# Patient Record
Sex: Male | Born: 1955
Health system: Southern US, Community
[De-identification: ages and names within clinical notes are randomized; demographics above are authoritative.]

## PROBLEM LIST (undated history)

## (undated) DIAGNOSIS — E785 Hyperlipidemia, unspecified: Secondary | ICD-10-CM

## (undated) DIAGNOSIS — I10 Essential (primary) hypertension: Secondary | ICD-10-CM

## (undated) DIAGNOSIS — T7840XA Allergy, unspecified, initial encounter: Secondary | ICD-10-CM

## (undated) DIAGNOSIS — E119 Type 2 diabetes mellitus without complications: Secondary | ICD-10-CM

## (undated) DIAGNOSIS — J309 Allergic rhinitis, unspecified: Secondary | ICD-10-CM

## (undated) HISTORY — DX: Hyperlipidemia, unspecified: E78.5

## (undated) HISTORY — DX: Type 2 diabetes mellitus without complications: E11.9

## (undated) HISTORY — DX: Allergy, unspecified, initial encounter: T78.40XA

## (undated) HISTORY — PX: ANTERIOR CRUCIATE LIGAMENT REPAIR: SHX115

## (undated) HISTORY — PX: OTHER SURGICAL HISTORY: SHX169

## (undated) HISTORY — DX: Essential (primary) hypertension: I10

## (undated) HISTORY — PX: COLONOSCOPY: SHX174

## (undated) HISTORY — DX: Allergic rhinitis, unspecified: J30.9

---

## 2004-10-29 ENCOUNTER — Ambulatory Visit: Payer: Self-pay | Admitting: Internal Medicine

## 2005-06-22 ENCOUNTER — Ambulatory Visit: Payer: Self-pay | Admitting: Internal Medicine

## 2005-06-24 ENCOUNTER — Ambulatory Visit: Payer: Self-pay | Admitting: Internal Medicine

## 2005-08-27 ENCOUNTER — Ambulatory Visit: Payer: Self-pay | Admitting: Internal Medicine

## 2005-10-27 ENCOUNTER — Ambulatory Visit: Payer: Self-pay | Admitting: Family Medicine

## 2006-07-13 ENCOUNTER — Ambulatory Visit: Payer: Self-pay | Admitting: Internal Medicine

## 2006-07-28 ENCOUNTER — Ambulatory Visit: Payer: Self-pay | Admitting: Internal Medicine

## 2006-09-02 ENCOUNTER — Ambulatory Visit: Payer: Self-pay | Admitting: Internal Medicine

## 2006-09-27 ENCOUNTER — Ambulatory Visit: Payer: Self-pay | Admitting: Internal Medicine

## 2007-08-20 ENCOUNTER — Emergency Department (HOSPITAL_COMMUNITY): Admission: EM | Admit: 2007-08-20 | Discharge: 2007-08-21 | Payer: Self-pay | Admitting: Family Medicine

## 2007-08-22 ENCOUNTER — Encounter: Payer: Self-pay | Admitting: Internal Medicine

## 2007-08-22 ENCOUNTER — Ambulatory Visit: Payer: Self-pay | Admitting: Internal Medicine

## 2007-08-22 DIAGNOSIS — I10 Essential (primary) hypertension: Secondary | ICD-10-CM | POA: Insufficient documentation

## 2007-08-22 DIAGNOSIS — E785 Hyperlipidemia, unspecified: Secondary | ICD-10-CM | POA: Insufficient documentation

## 2007-08-30 ENCOUNTER — Ambulatory Visit: Payer: Self-pay

## 2007-09-21 ENCOUNTER — Ambulatory Visit: Payer: Self-pay | Admitting: Internal Medicine

## 2007-09-21 LAB — CONVERTED CEMR LAB
ALT: 47 units/L (ref 0–53)
AST: 27 units/L (ref 0–37)
Alkaline Phosphatase: 66 units/L (ref 39–117)
BUN: 14 mg/dL (ref 6–23)
Basophils Relative: 0.3 % (ref 0.0–1.0)
Bilirubin, Direct: 0.3 mg/dL (ref 0.0–0.3)
Calcium: 9.6 mg/dL (ref 8.4–10.5)
Chloride: 103 meq/L (ref 96–112)
Eosinophils Absolute: 0.1 10*3/uL (ref 0.0–0.6)
Eosinophils Relative: 0.9 % (ref 0.0–5.0)
GFR calc non Af Amer: 95 mL/min
Glucose, Bld: 114 mg/dL — ABNORMAL HIGH (ref 70–99)
Ketones, ur: NEGATIVE mg/dL
LDL Cholesterol: 96 mg/dL (ref 0–99)
MCV: 91.9 fL (ref 78.0–100.0)
Monocytes Relative: 9.3 % (ref 3.0–11.0)
Nitrite: NEGATIVE
Platelets: 293 10*3/uL (ref 150–400)
RBC: 4.82 M/uL (ref 4.22–5.81)
Specific Gravity, Urine: 1.01 (ref 1.000–1.03)
Total CHOL/HDL Ratio: 4.3
Total Protein, Urine: NEGATIVE mg/dL
Urine Glucose: NEGATIVE mg/dL
VLDL: 29 mg/dL (ref 0–40)
WBC: 6.9 10*3/uL (ref 4.5–10.5)

## 2007-09-23 ENCOUNTER — Encounter: Payer: Self-pay | Admitting: Internal Medicine

## 2007-09-23 ENCOUNTER — Ambulatory Visit: Payer: Self-pay | Admitting: Internal Medicine

## 2008-10-29 ENCOUNTER — Ambulatory Visit: Payer: Self-pay | Admitting: Internal Medicine

## 2008-10-29 LAB — CONVERTED CEMR LAB
ALT: 49 units/L (ref 0–53)
Alkaline Phosphatase: 59 units/L (ref 39–117)
Basophils Absolute: 0 10*3/uL (ref 0.0–0.1)
Bilirubin Urine: NEGATIVE
Bilirubin, Direct: 0.1 mg/dL (ref 0.0–0.3)
CO2: 31 meq/L (ref 19–32)
Calcium: 9 mg/dL (ref 8.4–10.5)
Glucose, Bld: 107 mg/dL — ABNORMAL HIGH (ref 70–99)
HDL: 40.6 mg/dL (ref >39.0)
Hemoglobin, Urine: NEGATIVE
Ketones, ur: NEGATIVE mg/dL
LDL Cholesterol: 108 mg/dL — ABNORMAL HIGH (ref 0–99)
Leukocytes, UA: NEGATIVE
Lymphocytes Relative: 35.1 % (ref 12.0–46.0)
MCHC: 35.8 g/dL (ref 30.0–36.0)
Monocytes Relative: 9.9 % (ref 3.0–12.0)
Neutrophils Relative %: 53.4 % (ref 43.0–77.0)
Nitrite: NEGATIVE
Platelets: 267 10*3/uL (ref 150–400)
Potassium: 3.7 meq/L (ref 3.5–5.1)
RDW: 11.5 % (ref 11.5–14.6)
Sodium: 140 meq/L (ref 135–145)
TSH: 0.84 microintl units/mL (ref 0.35–5.50)
Total Bilirubin: 0.9 mg/dL (ref 0.3–1.2)
Total CHOL/HDL Ratio: 4.2
Total Protein: 6.9 g/dL (ref 6.0–8.3)
Urobilinogen, UA: 0.2 (ref 0.0–1.0)
VLDL: 21 mg/dL (ref 0–40)
pH: 6 (ref 5.0–8.0)

## 2008-11-05 ENCOUNTER — Ambulatory Visit: Payer: Self-pay | Admitting: Internal Medicine

## 2009-08-21 ENCOUNTER — Telehealth: Payer: Self-pay | Admitting: Internal Medicine

## 2009-08-22 ENCOUNTER — Ambulatory Visit: Payer: Self-pay | Admitting: Internal Medicine

## 2009-10-01 ENCOUNTER — Encounter: Payer: Self-pay | Admitting: Internal Medicine

## 2009-10-01 ENCOUNTER — Encounter: Payer: Self-pay | Admitting: Sports Medicine

## 2009-10-09 ENCOUNTER — Ambulatory Visit: Payer: Self-pay | Admitting: Sports Medicine

## 2009-10-09 DIAGNOSIS — M76829 Posterior tibial tendinitis, unspecified leg: Secondary | ICD-10-CM | POA: Insufficient documentation

## 2009-10-09 DIAGNOSIS — R269 Unspecified abnormalities of gait and mobility: Secondary | ICD-10-CM | POA: Insufficient documentation

## 2009-12-12 ENCOUNTER — Ambulatory Visit (HOSPITAL_BASED_OUTPATIENT_CLINIC_OR_DEPARTMENT_OTHER): Admission: RE | Admit: 2009-12-12 | Discharge: 2009-12-13 | Payer: Self-pay | Admitting: Orthopedic Surgery

## 2010-01-21 ENCOUNTER — Telehealth: Payer: Self-pay | Admitting: Internal Medicine

## 2010-02-17 ENCOUNTER — Telehealth: Payer: Self-pay | Admitting: Internal Medicine

## 2010-03-24 ENCOUNTER — Ambulatory Visit: Payer: Self-pay | Admitting: Internal Medicine

## 2010-03-24 LAB — CONVERTED CEMR LAB
ALT: 71 units/L — ABNORMAL HIGH (ref 0–53)
AST: 44 units/L — ABNORMAL HIGH (ref 0–37)
Alkaline Phosphatase: 55 units/L (ref 39–117)
BUN: 13 mg/dL (ref 6–23)
Basophils Absolute: 0 10*3/uL (ref 0.0–0.1)
Bilirubin, Direct: 0.1 mg/dL (ref 0.0–0.3)
Calcium: 9.4 mg/dL (ref 8.4–10.5)
Cholesterol: 195 mg/dL (ref 0–200)
Eosinophils Relative: 1.6 % (ref 0.0–5.0)
GFR calc non Af Amer: 107 mL/min (ref >60)
Glucose, Bld: 104 mg/dL — ABNORMAL HIGH (ref 70–99)
HDL: 44.9 mg/dL (ref >39.00)
Ketones, ur: NEGATIVE mg/dL
LDL Cholesterol: 118 mg/dL — ABNORMAL HIGH (ref 0–99)
Leukocytes, UA: NEGATIVE
Lymphocytes Relative: 25.4 % (ref 12.0–46.0)
Monocytes Relative: 8.8 % (ref 3.0–12.0)
Neutrophils Relative %: 63.8 % (ref 43.0–77.0)
Platelets: 268 10*3/uL (ref 150.0–400.0)
Potassium: 4.3 meq/L (ref 3.5–5.1)
RDW: 12.6 % (ref 11.5–14.6)
Specific Gravity, Urine: 1.02 (ref 1.000–1.030)
TSH: 0.88 microintl units/mL (ref 0.35–5.50)
Total Bilirubin: 1.3 mg/dL — ABNORMAL HIGH (ref 0.3–1.2)
Total Protein, Urine: NEGATIVE mg/dL
Urine Glucose: NEGATIVE mg/dL
VLDL: 32.2 mg/dL (ref 0.0–40.0)
WBC: 6.8 10*3/uL (ref 4.5–10.5)
pH: 7 (ref 5.0–8.0)

## 2010-03-28 ENCOUNTER — Ambulatory Visit: Payer: Self-pay | Admitting: Internal Medicine

## 2010-03-31 DIAGNOSIS — S83509A Sprain of unspecified cruciate ligament of unspecified knee, initial encounter: Secondary | ICD-10-CM | POA: Insufficient documentation

## 2010-12-31 NOTE — Assessment & Plan Note (Signed)
Summary: CPX/[facility] HC/#/CD   Vital Signs:  Patient profile:   55 year old male Height:      67 inches Weight:      202 pounds BMI:     31.75 O2 Sat:      97 % on Room air Temp:     98.1 degrees F oral Pulse rate:   66 / minute BP sitting:   132 / 88  (left arm) Cuff size:   large  Vitals Entered By: Bill Salinas CMA (March 28, 2010 2:30 PM)  O2 Flow:  Room air CC: cpx, pt is due for tetanus/ ab  Vision Screening:      Vision Comments: Pt's last eye exam was 03/2009 with Dr Hyacinth Meeker   Primary Care Provider:  Akiel Fennell  CC:  cpx and pt is due for tetanus/ ab.  History of Present Illness: Patient presents for medical follow up. Last seen in September for vertigo. In the interval he had to have repair, arthroscopically, left ACL with cadaveric implant. He has also seen Dr. Darrick Penna for fallen arches and has had orthotics made. He is still having pain in the left foot. These events have limited his activity and he feels that his weigh gain is a consequence. He otherwise is feeling well.  Current Medications (verified): 1)  Hydrochlorothiazide 25 Mg Tabs (Hydrochlorothiazide) .... Take 1 Tablet By Mouth Once A Day 2)  Lipitor 40 Mg Tabs (Atorvastatin Calcium) .... Take 1 Tablet By Mouth Once A Day 3)  Aspirin 81 Mg  Tabs (Aspirin) .... Take 1 Tablet By Mouth Once A Day 4)  Claritin 10 Mg Tabs (Loratadine) .... Take 1 Tablet By Mouth Once A Day As Needed 5)  Vitamin C 1000 Mg Tabs (Ascorbic Acid) .... Take 1 Tablet By Mouth Once A Day 6)  Vitamin E 400 Unit Caps (Vitamin E) .... Take 1 Tablet By Mouth Once A Day 7)  Folic Acid 400 Mcg Tabs (Folic Acid) .... Take 1 Tablet By Mouth Once A Day  Allergies (verified): No Known Drug Allergies  Past History:  Past Medical History: Last updated: 08/22/2007 UCD Hyperlipidemia Hypertension  Past Surgical History: arthroscopic knee surgery - left '00 Afrthroscopic repair ACL- left '11 (Dan Murphy)  Family History: father died 48  MI brother s/p MI, PCI Brother - CAD/CABG @ 29 ('14) brother - CAD/MI then CABG - ('10) all brothers with hyperlipidemia mother with HTN Secondary kin paternal with diabetes  Social History: married - '76 2 sons - out of the house. 1st son is principal at [employer].  Married in fall '10 2nd son - working for [employer] ('11) but still looking. work -Oceanographer.  Review of Systems       The patient complains of weight gain.  The patient denies anorexia, fever, vision loss, hoarseness, chest pain, syncope, dyspnea on exertion, prolonged cough, abdominal pain, severe indigestion/heartburn, hematuria, muscle weakness, difficulty walking, depression, abnormal bleeding, and enlarged lymph nodes.    Physical Exam  General:  WNWD male who has gained some weight Head:  Normocephalic and atraumatic without obvious abnormalities. No apparent alopecia or balding. Eyes:  No corneal or conjunctival inflammation noted. EOMI. Perrla. Funduscopic exam benign, without hemorrhages, exudates or papilledema. Vision grossly normal. Ears:  External ear exam shows no significant lesions or deformities.  Otoscopic examination reveals clear canals, tympanic membranes are intact bilaterally without bulging, retraction, inflammation or discharge. Hearing is grossly normal bilaterally. Nose:  no external deformity and no external erythema.  Mouth:  Oral mucosa and oropharynx without lesions or exudates.  Teeth in good repair. Neck:  supple, full ROM, no thyromegaly, and no carotid bruits.   Chest Wall:  No deformities, masses, tenderness or gynecomastia noted. Lungs:  Normal respiratory effort, chest expands symmetrically. Lungs are clear to auscultation, no crackles or wheezes. Heart:  Normal rate and regular rhythm. S1 and S2 normal without gallop, murmur, click, rub or other extra sounds. Abdomen:  soft, non-tender, normal bowel sounds, and no hepatomegaly.   Rectal:  No external  abnormalities noted. Normal sphincter tone. No rectal masses or tenderness. Prostate:  Prostate gland firm and smooth, no enlargement, nodularity, tenderness, mass, asymmetry or induration. Msk:  normal ROM, no joint swelling, no joint warmth, no joint deformities, and no joint instability. Both knees look fine with no effusion or deformity  Pulses:  2+ radial and DP pulse Extremities:  No clubbing, cyanosis, edema, or deformity noted with normal full range of motion of all joints.   Neurologic:  alert & oriented X3, cranial nerves II-XII intact, strength normal in all extremities, sensation intact to pinprick, and gait normal.   Skin:  turgor normal, color normal, no rashes, and no ulcerations.   Cervical Nodes:  no anterior cervical adenopathy and no posterior cervical adenopathy.   Inguinal Nodes:  no R inguinal adenopathy and no L inguinal adenopathy.   Psych:  Oriented X3, memory intact for recent and remote, normally interactive, and good eye contact.     Impression & Recommendations:  Problem # 1:  Hx of ACL TEAR, RIGHT KNEE (ICD-844.2) He has made a good recovery. He is no released for full physical activity - he has been told July before he can waterski.  Problem # 2:  ABNORMALITY OF GAIT (ICD-781.2) He is still having foot pain. He follows with Dr. Darrick Penna. He is planning to buy supportive shoes, e.g. [employer].  Problem # 3:  HYPERTENSION (ICD-401.9)  His updated medication list for this problem includes:    Hydrochlorothiazide 25 Mg Tabs (Hydrochlorothiazide) .Marland Kitchen... Take 1 tablet by mouth once a day  BP today: 132/88 Prior BP: 121/86 (10/09/2009)  Adequate control - will continue present medication.  Problem # 4:  HYPERLIPIDEMIA (ICD-272.4) LDL is  118 - at minimum goal of less than 130, but with his family history further reduction to 100 or less is desireable.  Plan - continue present dose of medication           resume exercise ASAP and continue a healthy low-fat  diet.  His updated medication list for this problem includes:    Lipitor 40 Mg Tabs (Atorvastatin calcium) .Marland Kitchen... Take 1 tablet by mouth once a day  Problem # 5:  Preventive Health Care (ICD-V70.0) Medical history is unremarkable in the interval. Physical exam is normal. Prostate cancer screening is normal. He is due for colorectal cancer screening with colonoscopy - he will be scheduled by patient care coordinators. Lab results look fine. He is due for tetnus booster unless he can recall a booster in the last 10 years.  In summary - a very nice man who appears to be medically stable and orthopedically improving. He will return for full exam in 18-24 months or as needed.   Complete Medication List: 1)  Hydrochlorothiazide 25 Mg Tabs (Hydrochlorothiazide) .... Take 1 tablet by mouth once a day 2)  Lipitor 40 Mg Tabs (Atorvastatin calcium) .... Take 1 tablet by mouth once a day 3)  Aspirin 81 Mg Tabs (Aspirin) .Marland KitchenMarland KitchenMarland Kitchen  Take 1 tablet by mouth once a day 4)  Claritin 10 Mg Tabs (Loratadine) .... Take 1 tablet by mouth once a day as needed 5)  Vitamin C 1000 Mg Tabs (Ascorbic acid) .... Take 1 tablet by mouth once a day 6)  Vitamin E 400 Unit Caps (Vitamin e) .... Take 1 tablet by mouth once a day 7)  Folic Acid 400 Mcg Tabs (Folic acid) .... Take 1 tablet by mouth once a day  Patient: James Weaver Note: All result statuses are Final unless otherwise noted.  Tests: (1) Lipid Panel (LIPID)   Cholesterol               195 mg/dL                   1-610     ATP III Classification            Desirable:  < 200 mg/dL                    Borderline High:  200 - 239 mg/dL               High:  > = 240 mg/dL   Triglycerides        [H]  161.0 mg/dL                 9.6-045.4     Normal:  <150 mg/dL     Borderline High:  098 - 199 mg/dL   HDL                       11.91 mg/dL                 >47.82   VLDL Cholesterol          32.2 mg/dL                  9.5-62.1   LDL Cholesterol      [H]  308 mg/dL                    6-57  CHO/HDL Ratio:  CHD Risk                             4                    Men          Women     1/2 Average Risk     3.4          3.3     Average Risk          5.0          4.4     2X Average Risk          9.6          7.1     3X Average Risk          15.0          11.0                           Tests: (2) BMP (METABOL)   Sodium                    140 mEq/L  135-145   Potassium                 4.3 mEq/L                   3.5-5.1   Chloride                  100 mEq/L                   96-112   Carbon Dioxide            32 mEq/L                    19-32   Glucose              [H]  104 mg/dL                   13-08   BUN                       13 mg/dL                    6-57   Creatinine                0.8 mg/dL                   8.4-6.9   Calcium                   9.4 mg/dL                   6.2-95.2   GFR                       107.00 mL/min               >60  Tests: (3) CBC Platelet w/Diff (CBCD)   White Cell Count          6.8 K/uL                    4.5-10.5   Red Cell Count            4.86 Mil/uL                 4.22-5.81   Hemoglobin                16.1 g/dL                   84.1-32.4   Hematocrit                45.8 %                      39.0-52.0   MCV                       94.3 fl                     78.0-100.0   MCHC                      35.2 g/dL                   40.1-02.7   RDW  12.6 %                      11.5-14.6   Platelet Count            268.0 K/uL                  150.0-400.0   Neutrophil %              63.8 %                      43.0-77.0   Lymphocyte %              25.4 %                      12.0-46.0   Monocyte %                8.8 %                       3.0-12.0   Eosinophils%              1.6 %                       0.0-5.0   Basophils %               0.4 %                       0.0-3.0   Neutrophill Absolute      4.3 K/uL                    1.4-7.7   Lymphocyte Absolute       1.7 K/uL                     0.7-4.0   Monocyte Absolute         0.6 K/uL                    0.1-1.0  Eosinophils, Absolute                             0.1 K/uL                    0.0-0.7   Basophils Absolute        0.0 K/uL                    0.0-0.1  Tests: (4) Hepatic/Liver Function Panel (HEPATIC)   Total Bilirubin      [H]  1.3 mg/dL                   0.4-5.4   Direct Bilirubin          0.1 mg/dL                   0.9-8.1   Alkaline Phosphatase      55 U/L                      39-117   AST                  [H]  44 U/L  0-37   ALT                  [H]  71 U/L                      0-53   Total Protein             7.0 g/dL                    1.6-1.0   Albumin                   4.1 g/dL                    9.6-0.4  Tests: (5) TSH (TSH)   FastTSH                   0.88 uIU/mL                 0.35-5.50  Tests: (6) UDip Only (UDIP)   Color                     YELLOW       RANGE:  Yellow;Lt. Yellow   Clarity                   CLEAR                       Clear   Specific Gravity          1.020                       1.000 - 1.030   Urine Ph                  7.0                         5.0-8.0   Protein                   NEGATIVE                    Negative   Urine Glucose             NEGATIVE                    Negative   Ketones                   NEGATIVE                    Negative   Urine Bilirubin           NEGATIVE                    Negative   Blood                     TRACE-LYSED                 Negative   Urobilinogen              0.2                         0.0 - 1.0   Leukocyte Esterace        NEGATIVE  Negative   Nitrite                   NEGATIVE                    Negative  Tests: (7) Prostate Specific Antigen (PSA)   PSA-Hyb                   2.18 ng/mL                  0.10-4.00

## 2010-12-31 NOTE — Progress Notes (Signed)
  Phone Note Refill Request Message from:  Fax from Pharmacy on February 17, 2010 4:25 PM  Refills Requested: Medication #1:  HYDROCHLOROTHIAZIDE 25 MG TABS Take 1 tablet by mouth once a day   Supply Requested: 3 months Initial call taken by: Rock Nephew CMA,  February 17, 2010 4:25 PM    Prescriptions: HYDROCHLOROTHIAZIDE 25 MG TABS (HYDROCHLOROTHIAZIDE) Take 1 tablet by mouth once a day  #90Tablet x 3   Entered by:   Rock Nephew CMA   Authorized by:   Jacques Navy MD   Signed by:   Rock Nephew CMA on 02/17/2010   Method used:   Electronically to        [facility]  [address] [phone]* (retail)       [address]       [place], [place]  [address]       Ph: [phone]       Fax: [phone]   RxID:   [id number]

## 2010-12-31 NOTE — Progress Notes (Signed)
  Phone Note Refill Request Message from:  Fax from Pharmacy on January 21, 2010 12:22 PM  Refills Requested: Medication #1:  LIPITOR 40 MG TABS Take 1 tablet by mouth once a day Initial call taken by: Ami Bullins CMA,  January 21, 2010 12:22 PM    Prescriptions: LIPITOR 40 MG TABS (ATORVASTATIN CALCIUM) Take 1 tablet by mouth once a day  #90 x 3   Entered by:   Ami Bullins CMA   Authorized by:   Jacques Navy MD   Signed by:   Bill Salinas CMA on 01/21/2010   Method used:   Faxed to ...       [facility] ([facility]-order)       [address] [place], [place]  [address]       Ph: [phone]       Fax: [phone]   RxID:   [id number]

## 2011-01-20 ENCOUNTER — Telehealth: Payer: Self-pay | Admitting: Internal Medicine

## 2011-01-27 NOTE — Progress Notes (Signed)
  Phone Note Refill Request Message from:  Fax from Pharmacy on January 20, 2011 4:35 PM  Refills Requested: Medication #1:  LIPITOR 40 MG TABS Take 1 tablet by mouth once a day Initial call taken by: Ami Bullins CMA,  January 20, 2011 4:35 PM    Prescriptions: LIPITOR 40 MG TABS (ATORVASTATIN CALCIUM) Take 1 tablet by mouth once a day  #90 x 3   Entered by:   Ami Bullins CMA   Authorized by:   Jacques Navy MD   Signed by:   Bill Salinas CMA on 01/20/2011   Method used:   Faxed to ...       [facility] (mail-order)       [address] [place], [place]  [address]       Ph: [phone]       Fax: [phone]   RxID:   [id number]

## 2011-02-15 LAB — BASIC METABOLIC PANEL
CO2: 33 mEq/L — ABNORMAL HIGH (ref 19–32)
Calcium: 9.4 mg/dL (ref 8.4–10.5)
GFR calc Af Amer: >60 mL/min (ref >60)
Potassium: 4 mEq/L (ref 3.5–5.1)
Sodium: 139 mEq/L (ref 135–145)

## 2011-02-15 LAB — HEPATIC FUNCTION PANEL
ALT: 50 U/L (ref 0–53)
AST: 32 U/L (ref 0–37)
Albumin: 3.9 g/dL (ref 3.5–5.2)
Total Bilirubin: 1.2 mg/dL (ref 0.3–1.2)

## 2011-03-25 ENCOUNTER — Other Ambulatory Visit: Payer: Self-pay | Admitting: Internal Medicine

## 2011-03-25 ENCOUNTER — Other Ambulatory Visit (INDEPENDENT_AMBULATORY_CARE_PROVIDER_SITE_OTHER): Payer: Self-pay

## 2011-03-25 DIAGNOSIS — Z0000 Encounter for general adult medical examination without abnormal findings: Secondary | ICD-10-CM

## 2011-03-25 DIAGNOSIS — Z125 Encounter for screening for malignant neoplasm of prostate: Secondary | ICD-10-CM

## 2011-03-25 DIAGNOSIS — E785 Hyperlipidemia, unspecified: Secondary | ICD-10-CM

## 2011-03-25 DIAGNOSIS — I10 Essential (primary) hypertension: Secondary | ICD-10-CM

## 2011-03-25 LAB — LIPID PANEL
Cholesterol: 177 mg/dL (ref 0–200)
Triglycerides: 142 mg/dL (ref 0.0–149.0)
VLDL: 28.4 mg/dL (ref 0.0–40.0)

## 2011-03-25 LAB — CBC WITH DIFFERENTIAL/PLATELET
Basophils Relative: 0.5 % (ref 0.0–3.0)
Eosinophils Absolute: 0.1 10*3/uL (ref 0.0–0.7)
Lymphocytes Relative: 30.7 % (ref 12.0–46.0)
MCHC: 35.2 g/dL (ref 30.0–36.0)
Neutrophils Relative %: 55.7 % (ref 43.0–77.0)
Platelets: 270 10*3/uL (ref 150.0–400.0)
RBC: 4.7 Mil/uL (ref 4.22–5.81)
WBC: 6.8 10*3/uL (ref 4.5–10.5)

## 2011-03-25 LAB — BASIC METABOLIC PANEL
CO2: 32 mEq/L (ref 19–32)
Calcium: 9.3 mg/dL (ref 8.4–10.5)
Creatinine, Ser: 0.8 mg/dL (ref 0.4–1.5)
GFR: 111.41 mL/min (ref >60.00)
Sodium: 138 mEq/L (ref 135–145)

## 2011-03-25 LAB — PSA: PSA: 1.77 ng/mL (ref 0.10–4.00)

## 2011-03-25 LAB — HEPATIC FUNCTION PANEL
Bilirubin, Direct: 0.2 mg/dL (ref 0.0–0.3)
Total Protein: 6.8 g/dL (ref 6.0–8.3)

## 2011-03-31 ENCOUNTER — Ambulatory Visit (INDEPENDENT_AMBULATORY_CARE_PROVIDER_SITE_OTHER): Payer: 59 | Admitting: Internal Medicine

## 2011-03-31 ENCOUNTER — Encounter: Payer: Self-pay | Admitting: Internal Medicine

## 2011-03-31 VITALS — BP 120/82 | HR 66 | Temp 97.8°F | Wt 207.0 lb

## 2011-03-31 DIAGNOSIS — Z136 Encounter for screening for cardiovascular disorders: Secondary | ICD-10-CM

## 2011-03-31 NOTE — Progress Notes (Signed)
Subjective:    Patient ID: James Weaver, male    DOB: November 16, 1956, 55 y.o.   MRN: [id number]  HPI Mr. Capistran presents for a routine medical followup. He states that in the interval since his last visit - He had ACL repair Jan '11, good recovery and finished rehab. He gum a graft done in March - he was on antibiotics, amox tid for 14 days, after graft. He had no significant problems or complications. He has not erxercised like he should and has a 10 lb wait gain. Otherwise he has been doing well and feels well.   Past Medical History  Diagnosis Date  . Hyperlipemia   . Hypertension   . Allergic rhinitis, cause unspecified    Past Surgical History  Procedure Date  . Arthroscopic  knee surg     left 2000  . Anterior cruciate ligament repair Jan '11    left knee   Family History  Problem Relation Age of Onset  . Hypertension Mother   . Hyperlipidemia Mother   . Diabetes Mother   . Early death Father   . Heart disease Brother     CABG  . Hyperlipidemia Brother   . Hypertension Brother   . Heart disease Paternal Grandfather   . Stroke Neg Hx   . Heart disease Brother 70    CABG '11  . Hyperlipidemia Brother   . Hypertension Brother   . Hyperlipidemia Brother   . Hypertension Brother    History   Social History  . Marital Status: Married    Spouse Name: N/A    Number of Children: N/A  . Years of Education: N/A   Occupational History  . Not on file.   Social History Main Topics  . Smoking status: Never Smoker   . Smokeless tobacco: Not on file  . Alcohol Use: No  . Drug Use: Not on file  . Sexually Active: Yes -- Male partner(s)   Other Topics Concern  . Not on file   Social History Narrative   Married '76.  2 sons- out of the house. 1st son is principle at [employer]. Married in fall '10.  2nd son-working youth activity Interior and spatial designer - [employer]. 1 grandson -premie.  Work- Facilities manager. Empty nest is ok.         Review of  Systems Review of Systems  Constitutional:  Negative for fever, chills, activity change and unexpected weight change.  HENT:  Negative for hearing loss, ear pain, congestion, neck stiffness and postnasal drip.   Eyes: Negative for pain, discharge and visual disturbance.  Respiratory: Negative for chest tightness and wheezing.   Cardiovascular: Negative for chest pain and palpitations.       [No decreased exercise tolerance Gastrointestinal: [No change in bowel habit. No bloating or gas. No reflux or indigestion Genitourinary: Negative for urgency, frequency, flank pain and difficulty urinating.  Musculoskeletal: Negative for myalgias, back pain, arthralgias and gait problem.  Neurological: Negative for dizziness, tremors, weakness and headaches.  Hematological: Negative for adenopathy.  Psychiatric/Behavioral: Negative for behavioral problems and dysphoric mood.      Objective:   Physical Exam Constitutional: He is oriented to person, place, and time. He appears well-developed and well-nourished.       Healthy appearing white male in no acute distress  HENT:  Head: Normocephalic and atraumatic.  Right Ear: External ear normal. EAC/TM nl Left Ear: External ear normal.  EAC/TM nl Nose: Nose normal.  Mouth/Throat: Oropharynx is clear and moist.  Recent gum surgery apparent right Maxilla by the molar Eyes: Conjunctivae and EOM are normal. Pupils are equal, round, and reactive to light. Right eye exhibits no discharge. Left eye exhibits no discharge. No scleral icterus.  Neck: Normal range of motion. Neck supple. No JVD present. No tracheal deviation present. No thyromegaly present.  Cardiovascular: Normal rate, regular rhythm and normal heart sounds.  Exam reveals no gallop and no friction rub.   No murmur heard.      Quiet precordium. 2+ radial and DP pulses  Pulmonary/Chest: Effort normal. No respiratory distress. He has no wheezes. He has no rales. He exhibits no tenderness.       No  chest wall deformity  Abdominal: Soft. Bowel sounds are normal. He exhibits no distension. There is no tenderness. There is no rebound and no guarding.       No heptosplenomegaly  Genitourinary:Deferred to normal PSA. Musculoskeletal: Normal range of motion. He exhibits no edema and no tenderness.       Small and large joints without redness, synovial thickening or deformity. Full range of motion preserved about all small, median and large joints.  Lymphadenopathy:    He has no cervical adenopathy.  Neurological: He is alert and oriented to person, place, and time. He has normal reflexes. No cranial nerve deficit. Coordination normal.  Skin: Skin is warm and dry. No rash noted. No erythema.  Psychiatric: He has a normal mood and affect. His behavior is normal. Thought content normal.   Lab Results  Component Value Date   WBC 6.8 03/25/2011   HGB 15.8 03/25/2011   HCT 44.7 03/25/2011   PLT 270.0 03/25/2011   CHOL 177 03/25/2011   TRIG 142.0 03/25/2011   HDL 41.40 03/25/2011   ALT 40 03/25/2011   AST 29 03/25/2011   NA 138 03/25/2011   K 4.1 03/25/2011   CL 98 03/25/2011   CREATININE 0.8 03/25/2011   BUN 19 03/25/2011   CO2 32 03/25/2011   TSH 1.11 03/25/2011   PSA 1.77 03/25/2011   Lab Results  Component Value Date   LDLCALC 107* 03/25/2011           Assessment & Plan:  1. Hypertension - good control on present medications  2. Hyperlipidemia - LDL close to goal of 100 or less.  Plan - continue lipitor 40mg            Diet and exercise.  3. Ortho - good recovery from ACL repair.  4. Health Maintnenance: interval history as noted. Physical exam is normal. Lab results are within normal limits. He is current with colorectal cancer screening with last exam in '07. He is current with prostate cancer screening with normal and unchanged PSA. Immunizations - needs a Tdap.  In summary - a very nice man who is medically stable. We discussed weight management: smart food choices, portion size  control and exercise.  Target weight is a 185lbs.  Goal is to loose 1 lb per month to target (23 months). He will return as needed or in 1 year

## 2011-04-17 NOTE — Assessment & Plan Note (Signed)
[facility]                             [facility] OFFICE NOTE   NAME:James Weaver, James Weaver                     MRN:          [id number]  DATE:07/28/2006                            DOB:          [birth year]    PRIMARY CARE NOTE:  James Weaver is a 55 year old Caucasian gentleman well known  to me who returns for followup evaluation and exam. He is followed for mild  hypertension and for hyperlipidemia.  He was last seen in the office by  James Weaver October 27, 2005 for bronchitis which resolved with treatment.  The last physical exam was June 24, 2005.   INTERVAL HISTORY:  Patient reports that he has been feeling well and is  doing well.  He had worked hard on weight loss losing 17 pounds last year  and actually has continued with a stable weight with actually a 2 pound  lost.  He reports that he had eliminated carbohydrates and now has added  back carbohydrates in a limited fashion.  He does continue to exercise.  He  does admit there is room to reduce portion size.  He has otherwise had no  medical complaints and has been feeling well.   INTERVAL SOCIAL HISTORY:  The patient continues at his previous job with no  changes.  He has done well with this.  He has now been married for 30 years.  His oldest son is now James Weaver and has completed his masters  degree in education and administration and is looking to be a principle in  the states school system.  His younger son is married, is living by James Weaver in Financial planner position.  Patient reports his marriage is in good  health.  He continues to water ski on a regular basis and has been taking  his vacation at the beach.   PAST MEDICAL HISTORY:   SURGICAL:  Arthroscopy for torn medial meniscus in the past.  No other  surgeries noted.   MEDICAL:  1. Patient has had the usual childhood diseases.  2. History of urticaria.  3. History of hyperlipidemia.  4. Hypertension.  5. History  of a sprained ankle.   FAMILY HISTORY:  Father died at age 72 of an MI with a very positive  paternal kinship.  Patient has a younger brother who has had multiple Mis  and just recently underwent carotid endarterectomy.  Hyperlipidemia runs in  the family.   CURRENT MEDICATIONS:  1. Lipitor 40 mg daily.  2. Aspirin 81 mg daily.  3. Folic acid daily.  4. Vitamin E.  5. Vitamin C daily.  6. HCTZ 25 mg daily.  7. Claritin OTC daily.   REVIEW OF SYSTEMS:  Patient has had no constitutional problems.  He has had  an eye exam in the last 12 months.  No ENT, cardiovascular, respiratory, GI  or Weaver complaints.   EXAMINATION:  VITAL SIGNS:  Temperature was 97.7.  Blood pressure 120/80.  Pulse 64.  Weight 185.  Height 5 foot 7 inches.  GENERAL:  General appearance this is a well-nourished, well-developed  gentleman  looking under the stated biological age in no acute distress.  HEENT:  Normocephalic and atraumatic.  TMs were normal.  Oropharynx with  __________ dentition in good repair. No buckle or palate lesions were noted.  Posterior pharynx was clear.  Conjunctiva, sclerae was clear.  PERRLA, EMI  funduscopic exam was unremarkable.  NECK:  Supple without thyromegaly.  No adenopathy was noted in the cervical  supra-clavicular regions.  CHEST:  No CVA tenderness.  Lungs were clear to auscultation and depression.  CARDIOVASCULAR:  2+ peripheral pulses, no JVD or carotid bruits.  He had a  quiet precordium with regular rate and rhythm without murmurs, rubs or  gallops.  ABDOMEN:  Soft, no guarding or rebound.  No organo-splenomegaly was noted.  GENITALIA:  Normal male phallus bilaterally descended testicles without  masses.  RECTAL:  Normal sphincter tone was noted.  Patient had a very small  hemorrhoidal tag at the 7 o'clock position.  Prostate gland was smooth,  round, normal size and contour without abnormalities.  EXTREMITIES:  Without clubbing, cyanosis, edema.  No deformities were  noted.  NEUROLOGIC:  Non-focal.   LABORATORY DATA:  Laboratory from August 14 revealed normal electrolytes.  Glucose was 104, down from 120.  BUN 13.  Creatinine 1.0.  Liver functions  were normal.  Cholesterol is 154.  Triglycerides 116.  ACL was 39.7.  LDL  was 91.   ASSESSMENT/PLAN:  1. Hyperlipidemia.  Patient is at goal with a low density lipoprotein      >100.  We did discuss the potential use of Omega-3 fatty acids (fish      oil) and I felt this would be reasonable but not necessary.  2. Hypertension.  Patient's blood pressure is very well controlled on low      dose hydrochlorothiazide.  He will continue the same.  3. Health maintenance.  Patient's examination is unremarkable.  He did      have a chest x-ray June 24, 2005 which was normal.  Last 12-lead      electrocardiogram from June 24, 2005 was normal.  Patient is a      candidate now at age 75 for colorectal cancer screening.  Will be      referred to gastrointestinal for the same.   SUMMARY:  1. This is a very pleasant gentleman who seems to be doing well and is      medically stable.  2. He will continue on his present medical regimen and refill      prescriptions are provided.  3. He will return to see me on an as needed basis.                                   James Gess Norins, MD   MEN/MedQ  DD:  07/28/2006  DT:  07/29/2006  Job #:  [id number]   cc:   James Weaver

## 2011-09-10 LAB — I-STAT 8, (EC8 V) (CONVERTED LAB)
BUN: 15
Chloride: 103
HCT: 45
Hemoglobin: 15.3
Operator id: 294511
Sodium: 139

## 2011-09-10 LAB — POCT I-STAT CREATININE
Creatinine, Ser: 0.9
Operator id: 294511

## 2011-09-10 LAB — POCT CARDIAC MARKERS
Myoglobin, poc: 54.3
Operator id: 151321

## 2012-02-05 ENCOUNTER — Other Ambulatory Visit: Payer: Self-pay | Admitting: Internal Medicine

## 2012-02-08 ENCOUNTER — Other Ambulatory Visit: Payer: Self-pay | Admitting: Internal Medicine

## 2012-02-09 ENCOUNTER — Other Ambulatory Visit: Payer: Self-pay | Admitting: Internal Medicine

## 2012-02-17 NOTE — Telephone Encounter (Signed)
Pharmacy reports they recvd Rx refill authorization on 03.13.13

## 2012-05-09 ENCOUNTER — Other Ambulatory Visit: Payer: Self-pay | Admitting: Internal Medicine

## 2012-07-20 ENCOUNTER — Other Ambulatory Visit: Payer: Self-pay | Admitting: Internal Medicine

## 2012-07-25 ENCOUNTER — Other Ambulatory Visit: Payer: Self-pay | Admitting: Internal Medicine

## 2012-08-04 ENCOUNTER — Encounter: Payer: Self-pay | Admitting: Internal Medicine

## 2012-08-04 ENCOUNTER — Other Ambulatory Visit (INDEPENDENT_AMBULATORY_CARE_PROVIDER_SITE_OTHER): Payer: 59

## 2012-08-04 ENCOUNTER — Ambulatory Visit (INDEPENDENT_AMBULATORY_CARE_PROVIDER_SITE_OTHER): Payer: 59 | Admitting: Internal Medicine

## 2012-08-04 VITALS — BP 132/90 | HR 78 | Temp 98.1°F | Resp 16 | Wt 213.0 lb

## 2012-08-04 DIAGNOSIS — I10 Essential (primary) hypertension: Secondary | ICD-10-CM

## 2012-08-04 DIAGNOSIS — Z0000 Encounter for general adult medical examination without abnormal findings: Secondary | ICD-10-CM

## 2012-08-04 DIAGNOSIS — E785 Hyperlipidemia, unspecified: Secondary | ICD-10-CM

## 2012-08-04 DIAGNOSIS — Z23 Encounter for immunization: Secondary | ICD-10-CM

## 2012-08-04 LAB — COMPREHENSIVE METABOLIC PANEL
ALT: 37 U/L (ref 0–53)
CO2: 32 mEq/L (ref 19–32)
Chloride: 98 mEq/L (ref 96–112)
GFR: 91.42 mL/min (ref >60.00)
Sodium: 138 mEq/L (ref 135–145)
Total Bilirubin: 1.3 mg/dL — ABNORMAL HIGH (ref 0.3–1.2)
Total Protein: 7.4 g/dL (ref 6.0–8.3)

## 2012-08-04 LAB — HEPATIC FUNCTION PANEL
AST: 27 U/L (ref 0–37)
Albumin: 4.1 g/dL (ref 3.5–5.2)
Total Protein: 7.4 g/dL (ref 6.0–8.3)

## 2012-08-04 LAB — LDL CHOLESTEROL, DIRECT: Direct LDL: 110.2 mg/dL

## 2012-08-04 LAB — LIPID PANEL: Total CHOL/HDL Ratio: 5

## 2012-08-04 MED ORDER — ATENOLOL-CHLORTHALIDONE 50-25 MG PO TABS: 1.0000 | ORAL_TABLET | Freq: Every day | ORAL | Status: DC

## 2012-08-04 NOTE — Progress Notes (Signed)
Subjective:    Patient ID: James Weaver, male    DOB: Dec 30, 1955, 56 y.o.   MRN: [id number]  HPI James Weaver presents for an annual medical exam. He has had a good year with no new medical problems. He remains active. He is current with dental and eye care. He did have dot-blot hemorrhage OD but otherwise a normal exam. He is physically active but without a specific exercise except trying for regular walking.   Past Medical History  Diagnosis Date  . Hyperlipemia   . Hypertension   . Allergic rhinitis, cause unspecified    Past Surgical History  Procedure Date  . Arthroscopic  knee surg     left 2000  . Anterior cruciate ligament repair Jan '11    left knee   Family History  Problem Relation Age of Onset  . Hypertension Mother   . Hyperlipidemia Mother   . Diabetes Mother   . Early death Father   . Heart disease Brother     CABG  . Hyperlipidemia Brother   . Hypertension Brother   . Heart disease Paternal Grandfather   . Stroke Neg Hx   . Heart disease Brother 74    CABG '11  . Hyperlipidemia Brother   . Hypertension Brother   . Hyperlipidemia Brother   . Hypertension Brother   . Cancer Brother     glioblastoma multiforme   History   Social History  . Marital Status: Married    Spouse Name: N/A    Number of Children: 2  . Years of Education: 16   Occupational History  . finance    Social History Main Topics  . Smoking status: Never Smoker   . Smokeless tobacco: Never Used  . Alcohol Use: No  . Drug Use: No  . Sexually Active: Yes -- Male partner(s)   Other Topics Concern  . Not on file   Social History Narrative   Married '76.  2 sons- out of the house. 1st son is principle at [employer]. Married in fall '10.  2nd son-working youth activity M[employer]logger - [employer]. 1 grandson -premie.  Work- Programmer, systems. Empty nest is ok.     Current Outpatient Prescriptions on File Prior to Visit  Medication Sig Dispense Refill  . Ascorbic  Acid (VITAMIN C) 1000 MG tablet Take 1,000 mg by mouth daily.        Marland Kitchen aspirin 81 MG tablet Take 81 mg by mouth daily.        Marland Kitchen atorvastatin (LIPITOR) 40 MG tablet TAKE 1 TABLET DAILY  90 tablet  2  . folic acid (FOLVITE) 1 MG tablet Take 1 mg by mouth daily.        Marland Kitchen loratadine (CLARITIN) 10 MG tablet Take 10 mg by mouth daily.        . vitamin E 400 UNIT capsule Take 400 Units by mouth daily.        Marland Kitchen atenolol-chlorthalidone (TENORETIC) 50-25 MG per tablet Take 1 tablet by mouth daily.  30 tablet  2      Review of Systems Constitutional:  Negative for fever, chills, activity change and unexpected weight change.  HEENT:  Negative for hearing loss, ear pain, congestion, neck stiffness and postnasal drip. Negative for sore throat or swallowing problems. Negative for dental complaints.   Eyes: Negative for vision loss or change in visual acuity.  Respiratory: Negative for chest tightness and wheezing. Negative for DOE.   Cardiovascular: Negative for chest pain or  palpitations. No decreased exercise tolerance Gastrointestinal: No change in bowel habit. No bloating or gas. No reflux or indigestion Genitourinary: Negative for urgency, frequency, flank pain and difficulty urinating.  Musculoskeletal: Negative for myalgias, back pain, arthralgias and gait problem.  Neurological: Negative for dizziness, tremors, weakness and headaches.  Hematological: Negative for adenopathy.  Psychiatric/Behavioral: Negative for behavioral problems and dysphoric mood.       Objective:   Physical Exam Filed Vitals:   08/04/12 1333  BP: 132/90  Pulse: 78  Temp: 98.1 F (36.7 C)  Resp: 16   Wt Readings from Last 3 Encounters:  08/04/12 213 lb (96.616 kg)  03/31/11 207 lb (93.895 kg)  03/28/10 202 lb (91.627 kg)   Gen'l: Well nourished well developed white male in no acute distress  HEENT: Head: Normocephalic and atraumatic. Right Ear: External ear normal. EAC/TM nl. Left Ear: External ear normal.   EAC/TM nl. Nose: Nose normal. Mouth/Throat: Oropharynx is clear and moist. Dentition - native, in good repair. No buccal or palatal lesions. Posterior pharynx clear. Eyes: Conjunctivae and sclera clear. EOM intact. Pupils are equal, round, and reactive to light. Right eye exhibits no discharge. Left eye exhibits no discharge. Neck: Normal range of motion. Neck supple. No JVD present. No tracheal deviation present. No thyromegaly present.  Cardiovascular: Normal rate, regular rhythm, no gallop, no friction rub, no murmur heard.      Quiet precordium. 2+ radial and DP pulses . No carotid bruits Pulmonary/Chest: Effort normal. No respiratory distress or increased WOB, no wheezes, no rales. No chest wall deformity or CVAT. Abdomen: Soft. Bowel sounds are normal in all quadrants. He exhibits no distension, no tenderness, no rebound or guarding, No heptosplenomegaly  Genitourinary:  deferred Musculoskeletal: Normal range of motion. He exhibits no edema and no tenderness.       Small and large joints without redness, synovial thickening or deformity. Full range of motion preserved about all small, median and large joints.  Lymphadenopathy:    He has no cervical or supraclavicular adenopathy.  Neurological: He is alert and oriented to person, place, and time. CN II-XII intact. DTRs 2+ and symmetrical biceps, radial and patellar tendons. Cerebellar function normal with no tremor, rigidity, normal gait and station.  Skin: Skin is warm and dry. No rash noted. No erythema.  Psychiatric: He has a normal mood and affect. His behavior is normal. Thought content normal.   Lab Results  Component Value Date   WBC 6.8 03/25/2011   HGB 15.8 03/25/2011   HCT 44.7 03/25/2011   PLT 270.0 03/25/2011   GLUCOSE 109* 08/04/2012   CHOL 194 08/04/2012   TRIG 299.0* 08/04/2012   HDL 41.70 08/04/2012   LDLDIRECT 110.2 08/04/2012   LDLCALC 107* 03/25/2011        ALT 37 08/04/2012   AST 27 08/04/2012        NA 138 08/04/2012   K 3.8  08/04/2012   CL 98 08/04/2012   CREATININE 0.9 08/04/2012   BUN 19 08/04/2012   CO2 32 08/04/2012   TSH 1.11 03/25/2011   PSA 1.77 03/25/2011           Assessment & Plan:

## 2012-08-07 DIAGNOSIS — Z0001 Encounter for general adult medical examination with abnormal findings: Secondary | ICD-10-CM | POA: Insufficient documentation

## 2012-08-07 NOTE — Assessment & Plan Note (Signed)
LDL is above goal of 100 for patient at moderate to high risk despite Lipitor 40 mg.  Plan No change in medication  Increase regular aerobic exercise and continue on a low fat diet.  F/u lipid panel in 6 months

## 2012-08-07 NOTE — Assessment & Plan Note (Signed)
BP Readings from Last 3 Encounters:  08/04/12 132/90  03/31/11 120/82  03/28/10 132/88   Good control on present medications  Plan  No changes proposed in regimen. Refills provided

## 2012-08-07 NOTE — Assessment & Plan Note (Signed)
Interval medical history is benign. Physical exam is normal except for mild weight issue. Lab results in normal range. Current for colorectal cancer screening by his report (old chart to be pulled).Discussed pros and cons of prostate cancer screening (USPHCTF recommendations reviewed and ACU April '13 recommendations) and he defers evaluation at this time with last PSA April '12 = 1.77; April '11 2.18; Nov '09 3.57 .  In summary - a very nice man who is medically stable. Will continue risk reduction regimen and he will work hard on weight. Repeat lab in 6 months.

## 2012-09-06 ENCOUNTER — Ambulatory Visit (INDEPENDENT_AMBULATORY_CARE_PROVIDER_SITE_OTHER): Payer: 59 | Admitting: *Deleted

## 2012-09-06 VITALS — BP 112/72 | HR 60

## 2012-09-06 DIAGNOSIS — I10 Essential (primary) hypertension: Secondary | ICD-10-CM

## 2012-09-26 ENCOUNTER — Other Ambulatory Visit: Payer: Self-pay | Admitting: *Deleted

## 2012-09-26 MED ORDER — ATENOLOL-CHLORTHALIDONE 50-25 MG PO TABS: 1.0000 | ORAL_TABLET | Freq: Every day | ORAL | Status: DC

## 2012-09-26 NOTE — Telephone Encounter (Signed)
R'cd fax from [facility] for 90 day supply of Tenoretic.

## 2012-09-27 ENCOUNTER — Ambulatory Visit (INDEPENDENT_AMBULATORY_CARE_PROVIDER_SITE_OTHER): Payer: 59

## 2012-09-27 VITALS — BP 118/76

## 2012-09-27 DIAGNOSIS — I10 Essential (primary) hypertension: Secondary | ICD-10-CM

## 2012-09-27 MED ORDER — ATENOLOL-CHLORTHALIDONE 50-25 MG PO TABS: 1.0000 | ORAL_TABLET | Freq: Every day | ORAL | Status: DC

## 2012-11-11 ENCOUNTER — Other Ambulatory Visit: Payer: Self-pay | Admitting: Internal Medicine

## 2012-12-31 ENCOUNTER — Other Ambulatory Visit: Payer: Self-pay | Admitting: Internal Medicine

## 2013-01-02 ENCOUNTER — Other Ambulatory Visit: Payer: Self-pay | Admitting: *Deleted

## 2013-01-02 MED ORDER — ATENOLOL-CHLORTHALIDONE 50-25 MG PO TABS: 1.0000 | ORAL_TABLET | Freq: Every day | ORAL | Status: DC

## 2013-08-18 ENCOUNTER — Other Ambulatory Visit: Payer: Self-pay | Admitting: Internal Medicine

## 2013-11-17 ENCOUNTER — Other Ambulatory Visit: Payer: Self-pay | Admitting: Internal Medicine

## 2013-12-26 ENCOUNTER — Other Ambulatory Visit: Payer: Self-pay | Admitting: Internal Medicine

## 2014-02-15 ENCOUNTER — Encounter: Payer: Self-pay | Admitting: Family Medicine

## 2014-02-15 ENCOUNTER — Ambulatory Visit (INDEPENDENT_AMBULATORY_CARE_PROVIDER_SITE_OTHER): Payer: 59 | Admitting: Family Medicine

## 2014-02-15 VITALS — BP 110/70 | HR 65 | Temp 98.0°F | Wt 218.5 lb

## 2014-02-15 DIAGNOSIS — I10 Essential (primary) hypertension: Secondary | ICD-10-CM

## 2014-02-15 DIAGNOSIS — E785 Hyperlipidemia, unspecified: Secondary | ICD-10-CM

## 2014-02-15 MED ORDER — ATORVASTATIN CALCIUM 40 MG PO TABS: ORAL_TABLET | ORAL | Status: DC

## 2014-02-15 NOTE — Assessment & Plan Note (Signed)
Return for labs.  Diet and exercise d/w pt. No change in meds at this point.  He agrees.

## 2014-02-15 NOTE — Patient Instructions (Addendum)
Schedule a fasting labs visit on the way out.   We'll contact you with your lab report after they are resulted.   Work more exercise into your schedule.   Take care.  Glad to see you.

## 2014-02-15 NOTE — Progress Notes (Signed)
Pre visit review using our clinic review tool, if applicable. No additional management support is needed unless otherwise documented below in the visit note.  Hypertension:    Using medication without problems or lightheadedness: yes Chest pain with exertion:no Edema:no Short of breath:no  Elevated Cholesterol: Using medications without problems:yes Muscle aches: no Diet compliance: "not real well." Exercise: "not real well." Due for labs.   Weight has gradually increased.  Encouraged work on diet and exercise.  He's trying to walk more.   Meds, vitals, and allergies reviewed.   PMH and SH reviewed  ROS: See HPI.  Otherwise negative.    GEN: nad, alert and oriented HEENT: mucous membranes moist NECK: supple w/o LA CV: rrr. PULM: ctab, no inc wob ABD: soft, +bs EXT: no edema SKIN: no acute rash

## 2014-02-15 NOTE — Assessment & Plan Note (Addendum)
Return for labs.  Diet and exercise d/w pt. No change in meds at this point.  He agrees.  >25 minutes spent in face to face time with patient, >50% spent in counselling or coordination of care.

## 2014-02-16 ENCOUNTER — Other Ambulatory Visit (INDEPENDENT_AMBULATORY_CARE_PROVIDER_SITE_OTHER): Payer: 59

## 2014-02-16 ENCOUNTER — Telehealth: Payer: Self-pay | Admitting: Family Medicine

## 2014-02-16 DIAGNOSIS — I10 Essential (primary) hypertension: Secondary | ICD-10-CM

## 2014-02-16 LAB — COMPREHENSIVE METABOLIC PANEL
ALBUMIN: 4.1 g/dL (ref 3.5–5.2)
ALT: 37 U/L (ref 0–53)
AST: 26 U/L (ref 0–37)
Alkaline Phosphatase: 58 U/L (ref 39–117)
BUN: 23 mg/dL (ref 6–23)
CO2: 32 meq/L (ref 19–32)
Calcium: 9.3 mg/dL (ref 8.4–10.5)
Chloride: 97 mEq/L (ref 96–112)
Creatinine, Ser: 1 mg/dL (ref 0.4–1.5)
GFR: 80.62 mL/min (ref >60.00)
GLUCOSE: 138 mg/dL — AB (ref 70–99)
POTASSIUM: 3.1 meq/L — AB (ref 3.5–5.1)
SODIUM: 136 meq/L (ref 135–145)
TOTAL PROTEIN: 7.1 g/dL (ref 6.0–8.3)
Total Bilirubin: 1.1 mg/dL (ref 0.3–1.2)

## 2014-02-16 LAB — LIPID PANEL
CHOLESTEROL: 181 mg/dL (ref 0–200)
HDL: 36.1 mg/dL — ABNORMAL LOW (ref >39.00)
LDL Cholesterol: 78 mg/dL (ref 0–99)
TRIGLYCERIDES: 334 mg/dL — AB (ref 0.0–149.0)
Total CHOL/HDL Ratio: 5
VLDL: 66.8 mg/dL — ABNORMAL HIGH (ref 0.0–40.0)

## 2014-02-16 NOTE — Telephone Encounter (Signed)
Relevant patient education assigned to patient using Emmi. ° °

## 2014-02-19 ENCOUNTER — Other Ambulatory Visit: Payer: Self-pay | Admitting: Family Medicine

## 2014-02-19 ENCOUNTER — Telehealth: Payer: Self-pay | Admitting: Family Medicine

## 2014-02-19 DIAGNOSIS — R739 Hyperglycemia, unspecified: Secondary | ICD-10-CM

## 2014-02-19 NOTE — Telephone Encounter (Signed)
Relevant patient education assigned to patient using Emmi. ° °

## 2014-02-21 ENCOUNTER — Encounter: Payer: Self-pay | Admitting: Family Medicine

## 2014-02-22 ENCOUNTER — Encounter: Payer: Self-pay | Admitting: Family Medicine

## 2014-02-22 ENCOUNTER — Other Ambulatory Visit (INDEPENDENT_AMBULATORY_CARE_PROVIDER_SITE_OTHER): Payer: 59

## 2014-02-22 DIAGNOSIS — R739 Hyperglycemia, unspecified: Secondary | ICD-10-CM

## 2014-02-22 DIAGNOSIS — R7309 Other abnormal glucose: Secondary | ICD-10-CM

## 2014-02-22 DIAGNOSIS — E119 Type 2 diabetes mellitus without complications: Secondary | ICD-10-CM | POA: Insufficient documentation

## 2014-02-22 LAB — HEMOGLOBIN A1C: HEMOGLOBIN A1C: 6.6 % — AB (ref 4.6–6.5)

## 2014-02-22 LAB — GLUCOSE, RANDOM: Glucose, Bld: 138 mg/dL — ABNORMAL HIGH (ref 70–99)

## 2014-05-20 ENCOUNTER — Other Ambulatory Visit: Payer: Self-pay | Admitting: Family Medicine

## 2014-05-20 DIAGNOSIS — E119 Type 2 diabetes mellitus without complications: Secondary | ICD-10-CM

## 2014-05-23 ENCOUNTER — Other Ambulatory Visit: Payer: 59

## 2014-05-24 ENCOUNTER — Other Ambulatory Visit (INDEPENDENT_AMBULATORY_CARE_PROVIDER_SITE_OTHER): Payer: 59

## 2014-05-24 DIAGNOSIS — E119 Type 2 diabetes mellitus without complications: Secondary | ICD-10-CM

## 2014-05-24 LAB — HEMOGLOBIN A1C: HEMOGLOBIN A1C: 6.3 % (ref 4.6–6.5)

## 2014-05-29 ENCOUNTER — Ambulatory Visit: Payer: 59 | Admitting: Family Medicine

## 2014-06-06 ENCOUNTER — Ambulatory Visit (INDEPENDENT_AMBULATORY_CARE_PROVIDER_SITE_OTHER): Payer: 59 | Admitting: Family Medicine

## 2014-06-06 ENCOUNTER — Encounter: Payer: Self-pay | Admitting: Family Medicine

## 2014-06-06 VITALS — BP 110/68 | HR 60 | Temp 97.9°F | Wt 211.5 lb

## 2014-06-06 DIAGNOSIS — R739 Hyperglycemia, unspecified: Secondary | ICD-10-CM

## 2014-06-06 DIAGNOSIS — R7309 Other abnormal glucose: Secondary | ICD-10-CM

## 2014-06-06 NOTE — Progress Notes (Signed)
Pre visit review using our clinic review tool, if applicable. No additional management support is needed unless otherwise documented below in the visit note.  Elevated sugar prev.  Dw pt.   Lost weight with cutting carbs.  A1c down.  FH DM2 noted.  No DM2 meds.  He is happy about weight loss.  I thanked him for his effort.   Meds, vitals, and allergies reviewed.   ROS: See HPI.  Otherwise, noncontributory.  nad ncat Mmm rrr ctab abd soft Ext w/o edema

## 2014-06-06 NOTE — Patient Instructions (Signed)
Take care.  Keep working on your weight and recheck A1c in about 3-4 months at a lab visit.  We'll go from there. Glad to see you.

## 2014-06-07 NOTE — Assessment & Plan Note (Signed)
Elevated sugar prev.  Dw pt.   Lost weight with cutting carbs.  A1c down.  FH DM2 noted.  No DM2 meds.  He is happy about weight loss.  I thanked him for his effort.  Recheck A1c in a few months, lab visit.  We'll go from there.  Didn't schedule OV at this point.  He agrees.

## 2014-11-29 ENCOUNTER — Other Ambulatory Visit (INDEPENDENT_AMBULATORY_CARE_PROVIDER_SITE_OTHER): Payer: 59

## 2014-11-29 DIAGNOSIS — R739 Hyperglycemia, unspecified: Secondary | ICD-10-CM

## 2014-11-29 LAB — HEMOGLOBIN A1C: Hgb A1c MFr Bld: 6.7 % — ABNORMAL HIGH (ref 4.6–6.5)

## 2014-12-11 ENCOUNTER — Encounter: Payer: Self-pay | Admitting: Family Medicine

## 2014-12-11 ENCOUNTER — Ambulatory Visit (INDEPENDENT_AMBULATORY_CARE_PROVIDER_SITE_OTHER): Payer: 59 | Admitting: Family Medicine

## 2014-12-11 VITALS — BP 116/70 | HR 63 | Temp 98.8°F

## 2014-12-11 DIAGNOSIS — J019 Acute sinusitis, unspecified: Secondary | ICD-10-CM

## 2014-12-11 DIAGNOSIS — J0100 Acute maxillary sinusitis, unspecified: Secondary | ICD-10-CM | POA: Insufficient documentation

## 2014-12-11 MED ORDER — AMOXICILLIN-POT CLAVULANATE 875-125 MG PO TABS: 1.0000 | ORAL_TABLET | Freq: Two times a day (BID) | ORAL | Status: DC

## 2014-12-11 NOTE — Patient Instructions (Signed)
Start the antibiotics today and try to get some rest.  Use nasal saline and warm compresses on your face.  This should get better.  Take care.

## 2014-12-11 NOTE — Progress Notes (Signed)
Pre visit review using our clinic review tool, if applicable. No additional management support is needed unless otherwise documented below in the visit note.  Sick for about 2 weeks. Prev with clear rhinorrhea, now green, worsening overall recently.  No FCNAV.  Prev with some facial pain, ie likely sinus pain, some ST now.  Some cough.  Post nasal gtt.  1 week ago he had no chest sx.  He has tried mucinex in the meantime w/o much relief.  Taking tylenol for facial pain.    Meds, vitals, and allergies reviewed.   ROS: See HPI.  Otherwise, noncontributory.  GEN: nad, alert and oriented HEENT: mucous membranes moist, tm w/o erythema, nasal exam w/o erythema, clear discharge noted,  OP with cobblestoning NECK: supple w/o LA CV: rrr.   PULM: ctab, no inc wob EXT: no edema SKIN: no acute rash

## 2014-12-11 NOTE — Assessment & Plan Note (Signed)
Likely, nontoxic, d/w pt.  Supportive care and start augmentin. F/u prn.  He agrees.

## 2014-12-13 ENCOUNTER — Other Ambulatory Visit: Payer: Self-pay | Admitting: Family Medicine

## 2014-12-14 ENCOUNTER — Other Ambulatory Visit: Payer: Self-pay | Admitting: *Deleted

## 2014-12-14 MED ORDER — ATENOLOL-CHLORTHALIDONE 50-25 MG PO TABS: 1.0000 | ORAL_TABLET | Freq: Every day | ORAL | Status: DC

## 2014-12-16 ENCOUNTER — Other Ambulatory Visit: Payer: Self-pay | Admitting: Family Medicine

## 2014-12-17 ENCOUNTER — Other Ambulatory Visit: Payer: Self-pay | Admitting: *Deleted

## 2015-01-15 ENCOUNTER — Telehealth: Payer: Self-pay | Admitting: Family Medicine

## 2015-01-15 NOTE — Telephone Encounter (Signed)
Patient E-mailed in the "contact us" section of [email or web address]  Dr. Dwana CurdGraham S. Para Marchuncan at [facility] is my Primary Physician. Question for Dr. Para Marchuncan . . .  Is it safe to take Protandim dietary supplement ? Is it truly effective in increasing anti-oxidants and improving health ? If not truly effective - - I see no reason to spend the $50 per month cost for this supplement  [email or web address]

## 2015-01-15 NOTE — Telephone Encounter (Signed)
Left detailed message on voicemail.  

## 2015-01-15 NOTE — Telephone Encounter (Signed)
It's not regulated, unclear if safe to take.  I wouldn't take it.  Thanks.

## 2015-01-17 ENCOUNTER — Encounter: Payer: Self-pay | Admitting: Family Medicine

## 2015-05-19 ENCOUNTER — Other Ambulatory Visit: Payer: Self-pay | Admitting: Family Medicine

## 2015-05-19 DIAGNOSIS — R739 Hyperglycemia, unspecified: Secondary | ICD-10-CM

## 2015-05-21 ENCOUNTER — Other Ambulatory Visit: Payer: 59

## 2015-05-27 ENCOUNTER — Other Ambulatory Visit: Payer: Self-pay

## 2015-05-27 ENCOUNTER — Other Ambulatory Visit (INDEPENDENT_AMBULATORY_CARE_PROVIDER_SITE_OTHER): Payer: 59

## 2015-05-27 DIAGNOSIS — R739 Hyperglycemia, unspecified: Secondary | ICD-10-CM | POA: Diagnosis not present

## 2015-05-27 LAB — LIPID PANEL
CHOLESTEROL: 163 mg/dL (ref 0–200)
HDL: 36.1 mg/dL — AB (ref >39.00)
LDL Cholesterol: 88 mg/dL (ref 0–99)
NonHDL: 126.9
Total CHOL/HDL Ratio: 5
Triglycerides: 196 mg/dL — ABNORMAL HIGH (ref 0.0–149.0)
VLDL: 39.2 mg/dL (ref 0.0–40.0)

## 2015-05-27 LAB — COMPREHENSIVE METABOLIC PANEL
ALT: 39 U/L (ref 0–53)
AST: 25 U/L (ref 0–37)
Albumin: 3.8 g/dL (ref 3.5–5.2)
Alkaline Phosphatase: 54 U/L (ref 39–117)
BUN: 18 mg/dL (ref 6–23)
CALCIUM: 9.2 mg/dL (ref 8.4–10.5)
CO2: 33 mEq/L — ABNORMAL HIGH (ref 19–32)
CREATININE: 0.92 mg/dL (ref 0.40–1.50)
Chloride: 99 mEq/L (ref 96–112)
GFR: 89.39 mL/min (ref >60.00)
Glucose, Bld: 139 mg/dL — ABNORMAL HIGH (ref 70–99)
Potassium: 3.5 mEq/L (ref 3.5–5.1)
SODIUM: 137 meq/L (ref 135–145)
Total Bilirubin: 1.3 mg/dL — ABNORMAL HIGH (ref 0.2–1.2)
Total Protein: 6.9 g/dL (ref 6.0–8.3)

## 2015-05-27 LAB — HEMOGLOBIN A1C: HEMOGLOBIN A1C: 6.6 % — AB (ref 4.6–6.5)

## 2015-05-30 ENCOUNTER — Ambulatory Visit (INDEPENDENT_AMBULATORY_CARE_PROVIDER_SITE_OTHER): Payer: 59 | Admitting: Family Medicine

## 2015-05-30 ENCOUNTER — Encounter: Payer: Self-pay | Admitting: Family Medicine

## 2015-05-30 VITALS — BP 104/70 | HR 63 | Temp 98.3°F | Ht 67.0 in | Wt 219.5 lb

## 2015-05-30 DIAGNOSIS — Z0000 Encounter for general adult medical examination without abnormal findings: Secondary | ICD-10-CM

## 2015-05-30 DIAGNOSIS — Z7189 Other specified counseling: Secondary | ICD-10-CM

## 2015-05-30 DIAGNOSIS — E785 Hyperlipidemia, unspecified: Secondary | ICD-10-CM | POA: Diagnosis not present

## 2015-05-30 DIAGNOSIS — I10 Essential (primary) hypertension: Secondary | ICD-10-CM | POA: Diagnosis not present

## 2015-05-30 DIAGNOSIS — E119 Type 2 diabetes mellitus without complications: Secondary | ICD-10-CM

## 2015-05-30 MED ORDER — ATENOLOL-CHLORTHALIDONE 50-25 MG PO TABS
1.0000 | ORAL_TABLET | Freq: Every day | ORAL | Status: DC
Start: 2015-05-30 — End: 2016-02-26

## 2015-05-30 MED ORDER — ATORVASTATIN CALCIUM 40 MG PO TABS: 40.0000 mg | ORAL_TABLET | Freq: Every day | ORAL | Status: DC

## 2015-05-30 NOTE — Patient Instructions (Addendum)
Check with your insurance to see if they will cover the shingles shot when you turn 60.  I would get a flu shot each fall.   Recheck labs in about 3 months before a visit.   Cut out carbs.  More exercise/walking- treat it like your job.   Take care.  Glad to see you.

## 2015-05-30 NOTE — Progress Notes (Signed)
Pre visit review using our clinic review tool, if applicable. No additional management support is needed unless otherwise documented below in the visit note.  CPE- See plan.  Routine anticipatory guidance given to patient.  See health maintenance. Tetanus 2013 Flu encouarged PNA not due yet, d/w pt (assuming he can get A1c down, see below) Shingles due at 60.   Colonoscopy 2007 Prostate cancer screening and PSA options (with potential risks and benefits of testing vs not testing) were discussed along with recent recs/guidelines.  He declined testing PSA at this point. Living will d/w pt.  Wife designated if patient were incapacitated.   Diet and exercise d/w pt.    DM2.  No meds.  Borderline A1c at 6.6.  D/w pt about weight loss.  This is the main goal for patient.  He has yearly eye exams.  We talked about going to nutrition, offered; d/w pt about eating out and routine instructions for low carb meals.  We talked about diet and exercise.  Labs d/w pt.    Hypertension:    Using medication without problems or lightheadedness: yes Chest pain with exertion:no Edema:no Short of breath:no Likely deconditioned  Elevated Cholesterol: Using medications without problems: yes Muscle aches: no Diet compliance:encouraged Exercise: encouraged  PMH and SH reviewed  Meds, vitals, and allergies reviewed.   ROS: See HPI.  Otherwise negative.    GEN: nad, alert and oriented HEENT: mucous membranes moist NECK: supple w/o LA CV: rrr. PULM: ctab, no inc wob ABD: soft, +bs EXT: no edema SKIN: no acute rash

## 2015-05-31 DIAGNOSIS — Z7189 Other specified counseling: Secondary | ICD-10-CM | POA: Insufficient documentation

## 2015-05-31 NOTE — Assessment & Plan Note (Signed)
No meds. Borderline A1c at 6.6. D/w pt about weight loss. This is the main goal for patient. He has yearly eye exams. We talked about going to nutrition, offered; d/w pt about eating out and routine instructions for low carb meals. We talked about diet and exercise. Labs d/w pt.  Will recheck in about 3 months.  If he gets his weight down, he'll likely not have Dm2.  D/w pt.   Will hold off on other DM2 interventions for now.  Can address if weight isn't down.  He agrees.

## 2015-05-31 NOTE — Assessment & Plan Note (Signed)
Controlled but needs weight loss. Continue current meds.  He agrees.

## 2015-05-31 NOTE — Assessment & Plan Note (Signed)
Routine anticipatory guidance given to patient.  See health maintenance. Tetanus 2013 Flu encouarged PNA not due yet, d/w pt (assuming he can get A1c down, see DM2 discussion) Shingles due at 60.   Colonoscopy 2007 Prostate cancer screening and PSA options (with potential risks and benefits of testing vs not testing) were discussed along with recent recs/guidelines.  He declined testing PSA at this point. Living will d/w pt.  Wife designated if patient were incapacitated.   Diet and exercise d/w pt.

## 2015-05-31 NOTE — Assessment & Plan Note (Signed)
Needs weight loss for elevated TGs,d/w pt.  Continue statin.

## 2015-07-18 ENCOUNTER — Encounter: Payer: Self-pay | Admitting: Sports Medicine

## 2015-07-18 ENCOUNTER — Ambulatory Visit (INDEPENDENT_AMBULATORY_CARE_PROVIDER_SITE_OTHER): Payer: 59 | Admitting: Sports Medicine

## 2015-07-18 VITALS — BP 123/83 | HR 63 | Temp 98.6°F

## 2015-07-18 DIAGNOSIS — M7660 Achilles tendinitis, unspecified leg: Secondary | ICD-10-CM

## 2015-07-18 DIAGNOSIS — M67879 Other specified disorders of synovium and tendon, unspecified ankle and foot: Secondary | ICD-10-CM | POA: Diagnosis not present

## 2015-07-18 DIAGNOSIS — S86012A Strain of left Achilles tendon, initial encounter: Secondary | ICD-10-CM | POA: Insufficient documentation

## 2015-07-18 MED ORDER — MELOXICAM 15 MG PO TABS: ORAL_TABLET | ORAL | Status: DC

## 2015-07-18 NOTE — Patient Instructions (Signed)
Please follow up in two weeks.   Don't try to stretch it while it is still painful.

## 2015-07-18 NOTE — Assessment & Plan Note (Addendum)
Pain most likely 2/2 to either partial tear vs acute on chronic achilles tendonopathy  Hx of severe overpronation on left  Achilles measured at 0.93 cm on [place]  - will start Mobic 15 mg for 7 days daily then PRN thereafter  - provided 5/16" heel lift in his tennis shoes b/l  - may need to replace the medial heel wedge on left orthotic from 2010  - he will f/u in two weeks. Will determine at that time if he is appropriate to start Alfredson's maneuvers for rehab and will repeat US   Patient seen and evaluated with the above resident. I agree with his plan of care. We will repeat his ultrasound at follow-up. Hopefully we will be able to start him on an Alfredson heel drop program at that time.

## 2015-07-18 NOTE — Progress Notes (Signed)
  James Weaver - 59 y.o. male MRN [id number]  Date of birth: Jan 22, 1956   James Weaver is a 59 y.o. male who presents today for left achilles pain. James Weaver was previously seen in 2010 for left posterior tibial dysfunction. He was placed in custom orthotics at that time.  He presents today with pain in the medial aspect of his left Achilles about 4 cm proximal to the calcaneus. Last Thursday he was walking on the beach in halfway through 20 minute walk started having pain. He was walking barefoot while on the beach. He denies hearing a pop or any effusion when the pain started. Since that time he has pain with plantar flexion. He took some ibuprofen which helped relieve some of the pain. Activity such as his pushoff and walking and going up stairs seem to exacerbate the pain worse. When he is at rest or flat-footed the pain is minimal to nonexistent. He describes the pain as a soreness and somewhat tender. He denies any prior injury or surgery to left foot.  PMHx - Updated and reviewed.  Contributory factors include: DM2, HLD, HTN  Medications - ASA, atenolol-chlorthalidone    ROS Per HPI   Exam:  Filed Vitals:   07/18/15 0902  BP: 123/83  Pulse: 63  Temp: 98.6 F (37 C)   Gen: NAD Cardiorespiratory - Normal respiratory effort/rate.  Foot Exam:  Laterality: left Appearance: no effusion, left with overpronation and calcaneal valgus, normal PT firing with standing on tip toes    Tenderness: mild TTP along the medial aspect of achilles about 4 cm proximal  Range of Motion: normal plantarflexion, dorsiflexion, inversion and eversion Laxity: none Neurovascularly intact: yes  Arch: pes planus   Maneuvers: Anterior drawer: neg  Strength:  Dorsiflexion: 5/5 Plantarflexion: 5/5 Inversion: 5/5 Eversion: 5/5   Imaging:  Limited US: left achilles Achilles observed in long axis with a measurement of 0.93 cm at the greatest width. Hypoechoic changes observed in short axis at about 4 cm  proximal to the calcaneous. No effusion observed around achilles. Findings suggestive of a small partial achilles tear.

## 2015-07-31 ENCOUNTER — Ambulatory Visit (INDEPENDENT_AMBULATORY_CARE_PROVIDER_SITE_OTHER): Payer: 59 | Admitting: Sports Medicine

## 2015-07-31 ENCOUNTER — Encounter: Payer: Self-pay | Admitting: Sports Medicine

## 2015-07-31 VITALS — BP 120/68 | Ht 67.0 in | Wt 200.0 lb

## 2015-07-31 DIAGNOSIS — R269 Unspecified abnormalities of gait and mobility: Secondary | ICD-10-CM

## 2015-07-31 DIAGNOSIS — M7660 Achilles tendinitis, unspecified leg: Secondary | ICD-10-CM

## 2015-07-31 DIAGNOSIS — M67879 Other specified disorders of synovium and tendon, unspecified ankle and foot: Secondary | ICD-10-CM

## 2015-07-31 MED ORDER — NITROGLYCERIN 0.2 MG/HR TD PT24: MEDICATED_PATCH | TRANSDERMAL | Status: DC

## 2015-07-31 NOTE — Patient Instructions (Signed)

## 2015-07-31 NOTE — Progress Notes (Signed)
   Subjective:    Patient ID: James Weaver, male    DOB: March 03, 1956, 59 y.o.   MRN: [id number]  HPI   Patient comes in today for follow-up on left Achilles pain. Overall he has improved over the past 2 weeks but still walking with a slight limp. He is still on his meloxicam. He finds it helpful. He is also using his heel lifts in his shoes. He did reinjure the area when running with his granddaughter a few days ago. He felt a burning type pain along the distal Achilles. He denies any sort of pop or weakness thereafter. Previous ultrasound showed a thickened Achilles tendon with hypoechoic changes consistent with a partial tear.    Review of Systems     Objective:   Physical Exam  Well-developed, well-nourished. No acute distress. Awake alert and oriented 3.  Left heel: There is still tenderness to palpation along the distal Achilles tendon. Palpable thickening as well. No palpable defect. Good plantar flexion with Thompson's testing. Good strength. Neurovascular intact distally.  MSK ultrasound was repeated today. Images were obtained in both long and short view and compared to previous ultrasound images. The hypoechoic area seen a proximally 4 cm proximal to the calcaneus is once again visualized. It is a little more prominent than on his previous scan which leads me to believe that he may have injured this area further. It appears to be about a 20-25% tear in the Achilles tendon. No stump sign. Majority of the tendon is intact.      Assessment & Plan:  Partial Achilles tendon tear, left heel  Although the patient's symptoms are improving it does appear as though he has further injured his Achilles tendon. Therefore I want to start him on a topical nitroglycerin protocol. He will utilize a quarter patch daily. He will continue with his heel lifts. I have cautioned him against running and particularly against sprinting. I did give him the Alfredson heel drop exercises but I do not want  him to start them until he is ambulating normally. Follow-up with me in 4 weeks for reevaluation and repeat ultrasound. Call with questions or concerns in the interim.

## 2015-08-19 ENCOUNTER — Other Ambulatory Visit: Payer: Self-pay | Admitting: Family Medicine

## 2015-08-19 DIAGNOSIS — E119 Type 2 diabetes mellitus without complications: Secondary | ICD-10-CM

## 2015-08-26 ENCOUNTER — Other Ambulatory Visit (INDEPENDENT_AMBULATORY_CARE_PROVIDER_SITE_OTHER): Payer: 59

## 2015-08-26 DIAGNOSIS — E119 Type 2 diabetes mellitus without complications: Secondary | ICD-10-CM | POA: Diagnosis not present

## 2015-08-26 LAB — HEMOGLOBIN A1C: Hgb A1c MFr Bld: 6.3 % (ref 4.6–6.5)

## 2015-08-28 ENCOUNTER — Ambulatory Visit (INDEPENDENT_AMBULATORY_CARE_PROVIDER_SITE_OTHER): Payer: 59 | Admitting: Sports Medicine

## 2015-08-28 ENCOUNTER — Encounter: Payer: Self-pay | Admitting: Sports Medicine

## 2015-08-28 VITALS — BP 123/87 | HR 64 | Ht 67.0 in | Wt 200.0 lb

## 2015-08-28 DIAGNOSIS — M76821 Posterior tibial tendinitis, right leg: Secondary | ICD-10-CM | POA: Diagnosis not present

## 2015-08-28 DIAGNOSIS — S86012D Strain of left Achilles tendon, subsequent encounter: Secondary | ICD-10-CM

## 2015-08-28 DIAGNOSIS — R269 Unspecified abnormalities of gait and mobility: Secondary | ICD-10-CM | POA: Diagnosis not present

## 2015-08-28 DIAGNOSIS — M67879 Other specified disorders of synovium and tendon, unspecified ankle and foot: Secondary | ICD-10-CM

## 2015-08-28 NOTE — Assessment & Plan Note (Addendum)
Today's US findings have not changed compared to 08/31 [place] however, he has had significant symptom improvement with NTG patch and has reassuring exam (including neg Thompson's test).  Will continue conservative management for now with below recommendations: - continue NTG 1/4 patch daily (will likely need for total of 12 weeks) -Continue heel lifts - avoid achilles exercises for now to avoid re-injury - RTC in 4 weeks for follow-up and repeat ultrasound  Patient seen and evaluated with the above-named resident. I agree with the plan of care. Patient still has evidence of a partial thickness Achilles tendon tear. He seems to be making clinical progress even though the ultrasound does not show much of a change. Continue with treatment as outlined above and follow-up with me in 4 weeks for a follow-up ultrasound.

## 2015-08-28 NOTE — Progress Notes (Signed)
   Subjective:    Patient ID: James Weaver, male    DOB: 10/10/1956, 59 y.o.   MRN: [id number]  HPI Comments: James Weaver is a 59 year old male with PMH as below here for follow-up of his left achilles tendon tear.  He reports significant improvement s/p 4 week of NTG patch use.  He has not been doing any home exercises for the achilles due to fear of further injury.  He still feels weak and burning sensation in the tendon if he walks too fast.  No pain at rest.  No longer needs Mobic.    Past Medical History  Diagnosis Date  . Hyperlipemia   . Hypertension   . Allergic rhinitis, cause unspecified    Current Outpatient Prescriptions on File Prior to Visit  Medication Sig Dispense Refill  . aspirin 81 MG tablet Take 81 mg by mouth daily.      Marland Kitchen atenolol-chlorthalidone (TENORETIC) 50-25 MG per tablet Take 1 tablet by mouth daily. 90 tablet 3  . atorvastatin (LIPITOR) 40 MG tablet Take 1 tablet (40 mg total) by mouth daily. 90 tablet 3  . folic acid (FOLVITE) 1 MG tablet Take 1 mg by mouth daily.      Marland Kitchen loratadine (CLARITIN) 10 MG tablet Take 10 mg by mouth daily.      . meloxicam (MOBIC) 15 MG tablet Please take for 7 days straight with food then as needed for pain. 40 tablet 0  . nitroGLYCERIN (MINITRAN) 0.2 mg/hr patch Place 1/4 patch to affected area daily. 30 patch 1  . vitamin E 400 UNIT capsule Take 400 Units by mouth daily.       No current facility-administered medications on file prior to visit.    Review of Systems:  As per HPI     Filed Vitals:   08/28/15 0931  BP: 123/87  Pulse: 64  Height:  (1.702 m)  Weight: 200 lb (90.719 kg)     Objective:   Physical Exam  Constitutional: He is oriented to person, place, and time. He appears well-developed. No distress.  HENT:  Head: Normocephalic and atraumatic.  Musculoskeletal: Normal range of motion. He exhibits tenderness. He exhibits no edema.  L achilles non-TTP, Neg Thompson's test B/L; normal feet ROM;  R  foot:  Tender along anterior tibial tendon and pain elicited with dorsiflexion  Neurological: He is alert and oriented to person, place, and time.  5/5 strength - dorsi/plantarflexion, inversion/eversion  Skin: Skin is warm. He is not diaphoretic.  Psychiatric: He has a normal mood and affect. His behavior is normal. Judgment and thought content normal.    MSK ultrasound of the left Achilles was performed and compared to prior images. Overall there does not appear to be any significant change when compared to images from one month ago. He still has a tear that involves about 30% of the posterior fibers of the Achilles tendon. This is seen both on long and short view. Majority of the tendon remains intact but also remains thickened.      Assessment & Plan:  Please see problem based charting for assessment and plan.

## 2015-08-28 NOTE — Assessment & Plan Note (Signed)
Appears to have tibialis tendinitis, likely from compensating (for right achilles discomfort) with right foot.  He noted swelling in right medial ankle after prolonged standing three days ago but the swelling and pain have improved.  Recommend conservative measures as below: - resume Mobic daily for next few weeks for anti-inflam - continue orthotics - continue conservative care for achilles tear - RTC in 4 weeks for follow-up

## 2015-08-30 ENCOUNTER — Encounter: Payer: Self-pay | Admitting: Family Medicine

## 2015-08-30 ENCOUNTER — Ambulatory Visit (INDEPENDENT_AMBULATORY_CARE_PROVIDER_SITE_OTHER): Payer: 59 | Admitting: Family Medicine

## 2015-08-30 VITALS — BP 124/66 | HR 63 | Temp 98.1°F | Wt 216.5 lb

## 2015-08-30 DIAGNOSIS — Z119 Encounter for screening for infectious and parasitic diseases, unspecified: Secondary | ICD-10-CM

## 2015-08-30 DIAGNOSIS — R739 Hyperglycemia, unspecified: Secondary | ICD-10-CM

## 2015-08-30 NOTE — Patient Instructions (Addendum)
Goal for 1 pound a month weight loss.  Keep working on a low carb diet.   Exercise as directed by ortho.  Take care.  Glad to see you.  Recheck labs in about 6 months before a visit.

## 2015-08-30 NOTE — Progress Notes (Signed)
Pre visit review using our clinic review tool, if applicable. No additional management support is needed unless otherwise documented below in the visit note.  Partial achilles tear.  Seeing Dr. Margaretha Sheffield to get some help.  He is in heel lifts and using a nitro patch.  Exercise has been limited from that.   He has been working on diet more than exercise.  A1c improved.  He has a lower A1c now.  Not DM2 by A1c.  D/w pt.    Meds, vitals, and allergies reviewed.   ROS: See HPI.  Otherwise, noncontributory.  nad ncat Mmm Neck supple, no LA rrr ctab

## 2015-09-01 NOTE — Assessment & Plan Note (Signed)
Goal for 1 pound a month weight loss.  Keep working on a low carb diet.  Exercise as directed by ortho.  Recheck labs in about 6 months before a visit.  Pt opts in for HCV and HIV screening with next set of labs.  D/w pt re: routine screening.

## 2015-09-25 ENCOUNTER — Ambulatory Visit (INDEPENDENT_AMBULATORY_CARE_PROVIDER_SITE_OTHER): Payer: 59 | Admitting: Sports Medicine

## 2015-09-25 ENCOUNTER — Encounter: Payer: Self-pay | Admitting: Sports Medicine

## 2015-09-25 VITALS — BP 110/70 | Ht 67.0 in | Wt 200.0 lb

## 2015-09-25 DIAGNOSIS — M76821 Posterior tibial tendinitis, right leg: Secondary | ICD-10-CM | POA: Diagnosis not present

## 2015-09-25 DIAGNOSIS — S86012D Strain of left Achilles tendon, subsequent encounter: Secondary | ICD-10-CM | POA: Diagnosis not present

## 2015-09-25 NOTE — Progress Notes (Signed)
   Subjective:    Patient ID: James Weaver, male    DOB: 1956-08-15, 59 y.o.   MRN: [id number]  HPI  James Weaver is a 59 y/o male with PMHx as below presents for follow-up on LEFT Achilles Tendon Partial Tear.  He reports continued improvement using 1/4 of NTG patch daily, now s/p 8 weeks.  He has not been doing any home exercises for the achilles due to fear of further injury; waiting until today's appointment to get cleared after further improvement.  Rode a stationary bike for 30 minutes without pain.  No pain at rest. No longer needs Mobic.  Feels weakness (moreso than pain) during toe off portion of ambulation on the LEFT leg.    Now having RIGHT arch pain/heel pain as well secondary to altered gait.  He reports pain in heel and achilles are the worse in the morning.  Has not been wearing arch straps  Review of Systems  10-point ROS without any abnormalities other than above.    Objective:   Physical Exam   General: -- Oriented to person, place and time.  Well appearing and in NAD Head: -- NCAT  MSK: -- No edema or abnormality on inspection -- Normal ROM in cardinal movements on ankle.  0-70 degrees in plantar/dorsiflexion arc. -- 5/5 strength 5/5 in dorsi- and plantar flexion. 5/5 with inversion/eversion -- NON-tender along the LEFT achilles tendon -- TTP along posterior tib of RIGHT leg, tender to compression of the tarsal tunnel -- Reproducible right ankle pain with standing on his tiptoes.  Gait: -- Antalgic, limited stance phase on the LEFT   Skin: -- Warm/dry/intact  Psych: -- Normal mood/affect   MSK US: Exam today exhibits interval improvement relative to 09/28 exam / US. MSK of LEFT achilles shows about 15-20% hypoechoic area on the posterior aspect of mid-achilles tendon, visualized in short and long views. Significant thickness of Achilles compared to contralateral leg; majority of tendon is thick but otherwise is well appearing    Assessment & Plan:     #1. LEFT mid-substance achilles tendinopathy/parital tear Interval improvement in symptoms and MSK US today relative to last appointment with relative rest and daily user of 1/4 NTG patch, now x8 weeks.    Plan: -- Continue NTG 1/4 patch daily (will likely need for total of 12 weeks, 4 more weeks) -- Initiate Alfredson exercises, reviewed with pt today -- May use OTC topical NSAID to achilles tendon, mobic PRN -- F/u in 4-6 weeks for repeat assessment  #2. RIGHT posterior tibialis tendinitis/strain Patient with posterior tib dysfunction and pain on exam.  Suspect this is byproduct of altered gait secondary to his original injury.  Will pursue conservative treatment  Plan: -- Modified alfredson exercises above but with RIGHT foot/leg internal rotated to isolate posterior tib --Since his Achilles tendinopathy is improving I will remove the heel lifts from his shoes. There is a chance that this may be playing a role in his new onset posterior tibialis tendinitis. I've given him permission to resume using the heel lifts if his right Achilles once again becomes painful. -- repeat assessment at f/u above in 4-6 weeks  -- Ankle sleeve ([employer])

## 2015-10-28 ENCOUNTER — Encounter: Payer: Self-pay | Admitting: Sports Medicine

## 2015-10-28 ENCOUNTER — Ambulatory Visit (INDEPENDENT_AMBULATORY_CARE_PROVIDER_SITE_OTHER): Payer: 59 | Admitting: Sports Medicine

## 2015-10-28 VITALS — BP 126/88 | Ht 67.0 in | Wt 207.0 lb

## 2015-10-28 DIAGNOSIS — S86012D Strain of left Achilles tendon, subsequent encounter: Secondary | ICD-10-CM | POA: Diagnosis not present

## 2015-10-28 DIAGNOSIS — M76821 Posterior tibial tendinitis, right leg: Secondary | ICD-10-CM | POA: Diagnosis not present

## 2015-10-28 NOTE — Progress Notes (Signed)
   Subjective:    Patient ID: James Weaver, male    DOB: [birth year], 59 y.o.   MRN: [id number]  HPI   Patient comes in today for follow-up on left Achilles tendinopathy and right foot posterior tibialis tendinitis. Left Achilles is feeling much better. He has been on nitroglycerin for 3 months. He admits that he has not been overly compliant with his home exercises for either foot.    Review of Systems     Objective:   Physical Exam Well-developed, well-nourished. No acute distress  Left Achilles: Minimal tenderness to palpation along the Achilles tendon just proximal to the calcaneus. No significant swelling appreciated.  Right ankle: Tenderness to palpation along the course of the posterior tibialis tendon. No swelling.  Walking without a limp  MSK ultrasound of the left Achilles was performed. Achilles tendon is still thickened but the area of hypoechoic change is less when compared to previous scans. Findings are consistent with a healing partial thickness Achilles tendon tear.       Assessment & Plan:  Healing left Achilles tendinopathy Right foot/ankle posterior tibialis tendinopathy  I've explained the importance of resuming his home exercises (Alfredson heel drop exercises). I want him to continue with his nitroglycerin for another 6 weeks and follow-up with me at the end of that time. If his right foot/ankle pain persists then we may want to consider custom orthotics.

## 2015-12-10 ENCOUNTER — Ambulatory Visit (INDEPENDENT_AMBULATORY_CARE_PROVIDER_SITE_OTHER): Payer: 59 | Admitting: Sports Medicine

## 2015-12-10 ENCOUNTER — Encounter: Payer: Self-pay | Admitting: Sports Medicine

## 2015-12-10 ENCOUNTER — Ambulatory Visit: Payer: 59 | Admitting: Sports Medicine

## 2015-12-10 VITALS — BP 120/70 | Ht 67.0 in | Wt 207.0 lb

## 2015-12-10 DIAGNOSIS — S86012D Strain of left Achilles tendon, subsequent encounter: Secondary | ICD-10-CM

## 2015-12-10 NOTE — Progress Notes (Signed)
   Subjective:    Patient ID: James Weaver, male    DOB: Nov 01, 1956, 60 y.o.   MRN: [id number]  HPI   Patient comes in today for follow-up on a partial tear of his left Achilles tendon. It has been 5 months since his initial office visit. He is doing well. He has been using his nitroglycerin and doing his home exercises. Pain is minimal. He has also tried a modified heel drop exercise for his right foot/ankle posterior tibialis tendinopathy. He finds it is a little too painful to do on a step. His symptoms here are tolerable. He has two sets of custom orthotics, one made in our office and one made at [employer], which he uses in his shoes. He uses the [employer] made orthotics in his boots and his red orthotics in his tennis shoes. Both sets of orthotics are quite old. He is asking about a new pair.      Review of Systems     As above Objective:   Physical Exam  well-developed, well-nourished. No acute distress    Left Achilles tendon shows no significant tenderness to palpation. Mild thickening. No pain with Achilles stretches. No swelling. Neurovascular intact distally.   right ankle shows no significant tenderness to palpation along the posterior tibialis tendon. No pain with resisted foot inversion. Neurovascularly intact distally.   Patient has a flexible cavus foot which goes into slight pes planus with standing.   MSK ultrasound of the left Achilles tendon still shows some thickening but the hypoechoic area seen on the previous scans is smaller. This is consistent with a healing partial Achilles tendon tear.       Assessment & Plan:   5 months status post partial Achilles tendon tear, left foot  Right foot posterior tibialis tendinitis   Patient will continue his nitroglycerin protocol for one additional month. Continue with his home exercises as well. He will try to modify the exercises for his right foot posterior tibialis tendinitis by doing the exercises on a smaller elevated  surface such as a text book. He will follow-up in 3 months for repeat ultrasound of his left Achilles tendon but he will return to the office in one to 2 weeks for new custom orthotics.

## 2015-12-25 ENCOUNTER — Encounter: Payer: Self-pay | Admitting: Sports Medicine

## 2015-12-25 ENCOUNTER — Ambulatory Visit (INDEPENDENT_AMBULATORY_CARE_PROVIDER_SITE_OTHER): Payer: 59 | Admitting: Sports Medicine

## 2015-12-25 VITALS — BP 115/71 | Ht 67.0 in | Wt 245.0 lb

## 2015-12-25 DIAGNOSIS — S86012D Strain of left Achilles tendon, subsequent encounter: Secondary | ICD-10-CM

## 2015-12-25 DIAGNOSIS — M216X9 Other acquired deformities of unspecified foot: Secondary | ICD-10-CM

## 2015-12-25 NOTE — Progress Notes (Signed)
Patient ID: James Weaver, male   DOB: 03/12/1956, 60 y.o.   MRN: [id number]  Patient comes in today for custom orthotics. Please see his last office note for details regarding history and physical exam findings. In short, he has a partial Achilles tendon tear. Also a pes cavus foot. He has had custom orthotics in the past which were comfortable.  Custom orthotics were constructed today. Patient found them to be comfortable. Patient will follow-up with me in 10 weeks for repeat ultrasound of his Achilles tear. A total of 30 minutes was spent with the patient with greater than 50% of the time spent in face-to-face consultation discussing orthotic construction, instruction, and fitting.  Patient was fitted for a : standard, cushioned, semi-rigid orthotic. The orthotic was heated and afterward the patient stood on the orthotic blank positioned on the orthotic stand. The patient was positioned in subtalar neutral position and 10 degrees of ankle dorsiflexion in a weight bearing stance. After completion of molding, a stable base was applied to the orthotic blank. The blank was ground to a stable position for weight bearing. Size: 10 Base: Blue EVA Posting: none Additional orthotic padding: none

## 2016-01-30 ENCOUNTER — Other Ambulatory Visit: Payer: Self-pay | Admitting: *Deleted

## 2016-01-30 MED ORDER — NITROGLYCERIN 0.2 MG/HR TD PT24: MEDICATED_PATCH | TRANSDERMAL | Status: DC

## 2016-02-21 ENCOUNTER — Other Ambulatory Visit (INDEPENDENT_AMBULATORY_CARE_PROVIDER_SITE_OTHER): Payer: 59

## 2016-02-21 ENCOUNTER — Other Ambulatory Visit: Payer: Self-pay | Admitting: Family Medicine

## 2016-02-21 DIAGNOSIS — Z119 Encounter for screening for infectious and parasitic diseases, unspecified: Secondary | ICD-10-CM | POA: Diagnosis not present

## 2016-02-21 DIAGNOSIS — R739 Hyperglycemia, unspecified: Secondary | ICD-10-CM | POA: Diagnosis not present

## 2016-02-21 LAB — HEMOGLOBIN A1C: Hgb A1c MFr Bld: 6.4 % (ref 4.6–6.5)

## 2016-02-21 LAB — HIV ANTIBODY (ROUTINE TESTING W REFLEX): HIV: NONREACTIVE

## 2016-02-21 LAB — HEPATITIS C ANTIBODY: HCV Ab: NEGATIVE

## 2016-02-26 ENCOUNTER — Encounter: Payer: Self-pay | Admitting: Family Medicine

## 2016-02-26 ENCOUNTER — Ambulatory Visit (INDEPENDENT_AMBULATORY_CARE_PROVIDER_SITE_OTHER): Payer: 59 | Admitting: Family Medicine

## 2016-02-26 VITALS — BP 106/66 | HR 70 | Temp 98.5°F | Wt 210.5 lb

## 2016-02-26 DIAGNOSIS — R739 Hyperglycemia, unspecified: Secondary | ICD-10-CM

## 2016-02-26 MED ORDER — ATENOLOL-CHLORTHALIDONE 50-25 MG PO TABS: 0.5000 | ORAL_TABLET | Freq: Every day | ORAL | Status: DC

## 2016-02-26 NOTE — Patient Instructions (Addendum)
Recheck labs before a physical in about 6 months.  Okay to cut back on the atenolol, down to 1/2 tab a day.   Take care.  Glad to see you.

## 2016-02-26 NOTE — Progress Notes (Signed)
Pre visit review using our clinic review tool, if applicable. No additional management support is needed unless otherwise documented below in the visit note.  Hyperglycemia f/u.  Not DM2 by A1c but nearly, dw pt.  He is working on diet and exercise.  He has some weight loss based on his home scales.  Labs d/w pt.   On calories about 2000 cal per day.   BP is lower today and asking about taper of meds.  D/w pt.   Meds, vitals, and allergies reviewed.   ROS: See HPI.  Otherwise, noncontributory.  GEN: nad, alert and oriented NECK: supple w/o LA CV: rrr.  no murmur PULM: ctab, no inc wob EXT: no edema

## 2016-02-27 NOTE — Assessment & Plan Note (Signed)
Not DM2 by A1c but nearly, dw pt.  He is working on diet and exercise.  He has some weight loss based on his home scales.  Labs d/w pt.   Recheck in about 6 months.  Continue work on diet and exercise.  See AVS re: BP med taper.

## 2016-03-17 ENCOUNTER — Ambulatory Visit (INDEPENDENT_AMBULATORY_CARE_PROVIDER_SITE_OTHER): Payer: 59 | Admitting: Sports Medicine

## 2016-03-17 ENCOUNTER — Encounter: Payer: Self-pay | Admitting: Sports Medicine

## 2016-03-17 VITALS — BP 120/78 | Ht 67.0 in | Wt 245.0 lb

## 2016-03-17 DIAGNOSIS — S86012D Strain of left Achilles tendon, subsequent encounter: Secondary | ICD-10-CM | POA: Diagnosis not present

## 2016-03-17 NOTE — Progress Notes (Signed)
   Subjective:    Patient ID: James Weaver, male    DOB: Jun 09, 1956, 60 y.o.   MRN: [id number]  HPI   Patient comes in today for follow-up on left Achilles pain and partial tearing. He is doing very well. No pain. He was actually able to visit the beach this past weekend without any returning pain. He is wearing his custom orthotics. He has been diligent about wearing his nitroglycerin and doing his exercises. He is pleased with his progress to date.    Review of Systems     Objective:   Physical Exam  Well-developed, well-nourished. No acute distress  Left Achilles: There is still some thickening of the midportion of the Achilles tendon but it is not tender to palpation. No pain with Achilles stretches. Good strength. Neurovascularly intact distally.  MSK ultrasound of the left Achilles was performed. Images in both long and short axis show a continued thickening of the midportion of the Achilles tendon. There are still small hypoechoic changes in the deepest fibers of the Achilles tendon but, overall, there has been significant improvement when compared to his original scans.      Assessment & Plan:   Improved left Achilles tendinopathy/partial tearing  I think the thickening will be chronic. The tearing seen on his previous scans is much less obvious. Most importantly, he is doing very well clinically. I think he is okay to start to increase activity as tolerated. He will continue with his custom orthotics as well. He can discontinue his nitroglycerin at this time. Follow-up with me as needed.

## 2016-04-01 ENCOUNTER — Encounter: Payer: Self-pay | Admitting: Family Medicine

## 2016-07-19 ENCOUNTER — Other Ambulatory Visit: Payer: Self-pay | Admitting: Family Medicine

## 2016-07-19 NOTE — Telephone Encounter (Signed)
Due for CPE.  Sent. Thanks.  

## 2016-07-20 NOTE — Telephone Encounter (Signed)
Patient advised.

## 2016-07-28 ENCOUNTER — Other Ambulatory Visit: Payer: Self-pay | Admitting: Family Medicine

## 2016-08-23 ENCOUNTER — Other Ambulatory Visit: Payer: Self-pay | Admitting: Family Medicine

## 2016-08-23 DIAGNOSIS — R739 Hyperglycemia, unspecified: Secondary | ICD-10-CM

## 2016-08-23 DIAGNOSIS — Z125 Encounter for screening for malignant neoplasm of prostate: Secondary | ICD-10-CM

## 2016-08-23 DIAGNOSIS — I10 Essential (primary) hypertension: Secondary | ICD-10-CM

## 2016-08-24 ENCOUNTER — Other Ambulatory Visit: Payer: 59

## 2016-08-26 ENCOUNTER — Other Ambulatory Visit (INDEPENDENT_AMBULATORY_CARE_PROVIDER_SITE_OTHER): Payer: 59

## 2016-08-26 DIAGNOSIS — R739 Hyperglycemia, unspecified: Secondary | ICD-10-CM | POA: Diagnosis not present

## 2016-08-26 DIAGNOSIS — Z125 Encounter for screening for malignant neoplasm of prostate: Secondary | ICD-10-CM | POA: Diagnosis not present

## 2016-08-26 DIAGNOSIS — I10 Essential (primary) hypertension: Secondary | ICD-10-CM

## 2016-08-26 LAB — COMPREHENSIVE METABOLIC PANEL WITH GFR
ALT: 40 U/L (ref 0–53)
AST: 25 U/L (ref 0–37)
Albumin: 3.8 g/dL (ref 3.5–5.2)
Alkaline Phosphatase: 58 U/L (ref 39–117)
BUN: 16 mg/dL (ref 6–23)
CO2: 33 meq/L — ABNORMAL HIGH (ref 19–32)
Calcium: 9.3 mg/dL (ref 8.4–10.5)
Chloride: 99 meq/L (ref 96–112)
Creatinine, Ser: 0.89 mg/dL (ref 0.40–1.50)
GFR: 92.48 mL/min (ref >60.00)
Glucose, Bld: 126 mg/dL — ABNORMAL HIGH (ref 70–99)
Potassium: 4.4 meq/L (ref 3.5–5.1)
Sodium: 138 meq/L (ref 135–145)
Total Bilirubin: 1.1 mg/dL (ref 0.2–1.2)
Total Protein: 6.7 g/dL (ref 6.0–8.3)

## 2016-08-26 LAB — PSA: PSA: 2.95 ng/mL (ref 0.10–4.00)

## 2016-08-26 LAB — HEMOGLOBIN A1C: Hgb A1c MFr Bld: 6.5 % (ref 4.6–6.5)

## 2016-08-26 LAB — LIPID PANEL
CHOL/HDL RATIO: 4
Cholesterol: 156 mg/dL (ref 0–200)
HDL: 39.3 mg/dL (ref >39.00)
LDL Cholesterol: 79 mg/dL (ref 0–99)
NONHDL: 116.87
Triglycerides: 190 mg/dL — ABNORMAL HIGH (ref 0.0–149.0)
VLDL: 38 mg/dL (ref 0.0–40.0)

## 2016-08-28 ENCOUNTER — Encounter: Payer: 59 | Admitting: Family Medicine

## 2016-09-01 ENCOUNTER — Ambulatory Visit (INDEPENDENT_AMBULATORY_CARE_PROVIDER_SITE_OTHER): Payer: 59 | Admitting: Family Medicine

## 2016-09-01 ENCOUNTER — Encounter: Payer: Self-pay | Admitting: Family Medicine

## 2016-09-01 VITALS — BP 122/76 | HR 67 | Temp 97.8°F | Ht 67.0 in | Wt 220.8 lb

## 2016-09-01 DIAGNOSIS — E119 Type 2 diabetes mellitus without complications: Secondary | ICD-10-CM

## 2016-09-01 DIAGNOSIS — Z0000 Encounter for general adult medical examination without abnormal findings: Secondary | ICD-10-CM | POA: Diagnosis not present

## 2016-09-01 DIAGNOSIS — E785 Hyperlipidemia, unspecified: Secondary | ICD-10-CM

## 2016-09-01 DIAGNOSIS — I10 Essential (primary) hypertension: Secondary | ICD-10-CM

## 2016-09-01 DIAGNOSIS — Z8249 Family history of ischemic heart disease and other diseases of the circulatory system: Secondary | ICD-10-CM

## 2016-09-01 DIAGNOSIS — R739 Hyperglycemia, unspecified: Secondary | ICD-10-CM

## 2016-09-01 MED ORDER — ATENOLOL-CHLORTHALIDONE 50-25 MG PO TABS: 0.5000 | ORAL_TABLET | Freq: Every day | ORAL | 3 refills | Status: DC

## 2016-09-01 MED ORDER — ATORVASTATIN CALCIUM 40 MG PO TABS: 40.0000 mg | ORAL_TABLET | Freq: Every day | ORAL | 3 refills | Status: DC

## 2016-09-01 NOTE — Progress Notes (Signed)
Pre visit review using our clinic review tool, if applicable. No additional management support is needed unless otherwise documented below in the visit note. 

## 2016-09-01 NOTE — Patient Instructions (Addendum)
[facility] Limerick[facility]ion will call about your referral. Check with your insurance to see if they will cover the shingles shot. Call GI when feasible about a colonoscopy.   Recheck A1c in about 3 months at a nonfasting lab appointment.  We'll go from there.   Get your flu shot at work.  Take care.  Glad to see you.

## 2016-09-01 NOTE — Progress Notes (Signed)
CPE- See plan.  Routine anticipatory guidance given to patient.  See health maintenance.  Tetanus 2013  Flu to be done at work.    PNA not due yet, d/w pt (assuming he can get A1c down, see below) Shingles due at 60.   Colonoscopy 2007 PSA wnl, d/w pt . Living will d/w pt.  Wife designated if patient were incapacitated.   Diet and exercise d/w pt, esp about cutting out carbs.   Wife had a CVA and her speech was affected; she has a PFO repair scheduled.  She is currently in speech therapy.    D/w pt about cards eval given his FH.  He agreed.  Ordered.    Hypertension:    Using medication without problems or lightheadedness: yes Chest pain with exertion:no Edema:no Short of breath:no  Elevated Cholesterol: Using medications without problems:yes Muscle aches: no Diet compliance:encouarged.  Exercise:encouraged.  Labs d/w pt.   PMH and SH reviewed  Meds, vitals, and allergies reviewed.   ROS: Per HPI.  Unless specifically indicated otherwise in HPI, the patient denies:  General: fever. Eyes: acute vision changes ENT: sore throat Cardiovascular: chest pain Respiratory: SOB GI: vomiting GU: dysuria Musculoskeletal: acute back pain Derm: acute rash Neuro: acute motor dysfunction Psych: worsening mood Endocrine: polydipsia Heme: bleeding Allergy: hayfever  GEN: nad, alert and oriented HEENT: mucous membranes moist NECK: supple w/o LA CV: rrr. PULM: ctab, no inc wob ABD: soft, +bs EXT: no edema SKIN: no acute rash

## 2016-09-02 DIAGNOSIS — Z8249 Family history of ischemic heart disease and other diseases of the circulatory system: Secondary | ICD-10-CM | POA: Insufficient documentation

## 2016-09-02 NOTE — Assessment & Plan Note (Signed)
No change in meds.  Labs d/w pt.  Needs diet and exercise.  

## 2016-09-02 NOTE — Assessment & Plan Note (Signed)
No change in meds.  Labs d/w pt.  Needs diet and exercise.

## 2016-09-02 NOTE — Assessment & Plan Note (Signed)
Refer to cards. 

## 2016-09-02 NOTE — Assessment & Plan Note (Signed)
Tetanus 2013  Flu to be done at work.    PNA not due yet, d/w pt (see hyperglycemia discussion) Shingles due at 60, d/w pt.  Colonoscopy 2007 d/w pt.   PSA wnl, d/w pt . Living will d/w pt.  Wife designated if patient were incapacitated.   Diet and exercise d/w pt, esp about cutting out carbs.

## 2016-09-02 NOTE — Assessment & Plan Note (Signed)
Needs diet and exercise, weight loss. Recheck in about 3 months.  If he can get A1c lower he wouldn't need PNA vaccine, etc.  D/w pt.  We'll address after next A1c.

## 2016-09-09 ENCOUNTER — Encounter: Payer: Self-pay | Admitting: Internal Medicine

## 2016-10-25 NOTE — Progress Notes (Signed)
Cardiology Office Note   Date:  10/28/2016   ID:  Lum BabeRandy Harry Lederman, DOB 1956/11/12, MRN [id number]  PCP:  Crawford GivensGraham Duncan, MD  Cardiologist:   Charlton HawsPeter Cloud Graham, MD   Chief Complaint  Patient presents with  . Establish Care      History of Present Illness: James SonsRandy Harry Eichhorst is a 60 y.o. male who presents for evaluation of CAD. Family history of CAD CR;f also include HTN and elevated lpids.  Has had rare SSCP sharp and fleeting Worse in August when brother died I took  Care of his brother Genevie CheshireClyde who died of glioblastoma had brain bleed after being put on blood thinner For PE.   Patient has had stress test with Dr Arthur HolmsNorrins over 10 years ago ok On statin and BP meds for years  No rest pains ECG is abnormal with T wave inversions in 3,F     Past Medical History:  Diagnosis Date  . Allergic rhinitis, cause unspecified   . Hyperlipemia   . Hypertension     Past Surgical History:  Procedure Laterality Date  . ANTERIOR CRUCIATE LIGAMENT REPAIR  Jan '11   left knee  . arthroscopic  knee surg     left 2000     Current Outpatient Prescriptions  Medication Sig Dispense Refill  . aspirin 81 MG tablet Take 81 mg by mouth daily.      Marland Kitchen. atenolol-chlorthalidone (TENORETIC) 50-25 MG tablet Take 0.5-1 tablets by mouth daily. 90 tablet 3  . atorvastatin (LIPITOR) 40 MG tablet Take 1 tablet (40 mg total) by mouth daily. 90 tablet 3  . folic acid (FOLVITE) 1 MG tablet Take 1 mg by mouth daily.      Marland Kitchen. loratadine (CLARITIN) 10 MG tablet Take 10 mg by mouth daily.       No current facility-administered medications for this visit.     Allergies:   Patient has no known allergies.    Social History:  The patient  reports that he has never smoked. He has never used smokeless tobacco. He reports that he does not drink alcohol or use drugs.   Family History:  The patient's family history includes Cancer in his brother; Diabetes in his mother; Early death in his father; Heart disease in his  brother and paternal grandfather; Heart disease (age of onset: 9554) in his brother; Hyperlipidemia in his brother, brother, brother, and mother; Hypertension in his brother, brother, brother, and mother.    ROS:  Please see the history of present illness.   Otherwise, review of systems are positive for none.   All other systems are reviewed and negative.    PHYSICAL EXAM: VS:  BP 130/80   Pulse (!) 58   Ht 5\' 7"  (1.702 m)   Wt 100.2 kg (220 lb 12.8 oz)   SpO2 95%   BMI 34.58 kg/m  , BMI Body mass index is 34.58 kg/m. Affect appropriate Healthy:  appears stated age HEENT: normal Neck supple with no adenopathy JVP normal no bruits no thyromegaly Lungs clear with no wheezing and good diaphragmatic motion Heart:  S1/S2 no murmur, no rub, gallop or click PMI normal Abdomen: benighn, BS positve, no tenderness, no AAA no bruit.  No HSM or HJR Distal pulses intact with no bruits No edema Neuro non-focal Skin warm and dry No muscular weakness    EKG:  03/31/11 SR normal ECG  10/28/16 SR rate 54 T wave inversion 3,F    Recent Labs: 08/26/2016: ALT 40; BUN 16;  Creatinine, Ser 0.89; Potassium 4.4; Sodium 138    Lipid Panel    Component Value Date/Time   CHOL 156 08/26/2016 0841   TRIG 190.0 (H) 08/26/2016 0841   HDL 39.30 08/26/2016 0841   CHOLHDL 4 08/26/2016 0841   VLDL 38.0 08/26/2016 0841   LDLCALC 79 08/26/2016 0841   LDLDIRECT 110.2 08/04/2012 1517      Wt Readings from Last 3 Encounters:  10/28/16 100.2 kg (220 lb 12.8 oz)  09/01/16 100.1 kg (220 lb 12 oz)  03/17/16 111.1 kg (245 lb)      Other studies Reviewed: Additional studies/ records that were reviewed today include: Notes Dr Para Marchuncan  ASSESSMENT AND PLAN:  1.  CAD family history with both brothers CABG and dad died age 341 MI Atypical pain Abnormal ECG f/u stress echo. Also advised calcium score to further risk stratify and guid Intensity of statin Rx 2. HTN: Well controlled.  Continue current medications  and low sodium Dash type diet.   3. Chol: A low fat, low cholesterol is discussed with the patient, and a written copy is given to him.    Current medicines are reviewed at length with the patient today.  The patient does not have concerns regarding medicines.  The following changes have been made:  no change  Labs/ tests ordered today include: Ex Echo Calcium score   Orders Placed This Encounter  Procedures  . EKG 12-Lead     Disposition:   FU with me in a year if tests ok      Signed, Charlton HawsPeter Hyla Coard, MD  10/28/2016 11:37 AM    [facility] [address], [place], [place]  [address] Phone: [phone]; Fax: [phone]

## 2016-10-28 ENCOUNTER — Ambulatory Visit (INDEPENDENT_AMBULATORY_CARE_PROVIDER_SITE_OTHER): Payer: 59 | Admitting: Cardiovascular Disease

## 2016-10-28 ENCOUNTER — Encounter: Payer: Self-pay | Admitting: Cardiovascular Disease

## 2016-10-28 VITALS — BP 130/80 | HR 58 | Ht 67.0 in | Wt 220.8 lb

## 2016-10-28 DIAGNOSIS — R9431 Abnormal electrocardiogram [ECG] [EKG]: Secondary | ICD-10-CM

## 2016-10-28 DIAGNOSIS — Z8249 Family history of ischemic heart disease and other diseases of the circulatory system: Secondary | ICD-10-CM

## 2016-10-28 DIAGNOSIS — Z7689 Persons encountering health services in other specified circumstances: Secondary | ICD-10-CM

## 2016-10-28 NOTE — Patient Instructions (Addendum)
Medication Instructions:  Your physician recommends that you continue on your current medications as directed. Please refer to the Current Medication list given to you today.  Labwork: NONE  Testing/Procedures: Your physician has requested that you have a stress echocardiogram. For further information please visit [email or web address]. Please follow instruction sheet as given.  Cardiac CT scanning for Cardiac Calcium Score, (CAT scanning), is a noninvasive, special x-ray that produces cross-sectional images of the body using x-rays and a computer.   Follow-Up: Your physician wants you to follow-up in: 6 months with Dr. Eden EmmsNishan. You will receive a reminder letter in the mail two months in advance. If you don't receive a letter, please call our office to schedule the follow-up appointment.   If you need a refill on your cardiac medications before your next appointment, please call your pharmacy.

## 2016-11-02 ENCOUNTER — Ambulatory Visit (INDEPENDENT_AMBULATORY_CARE_PROVIDER_SITE_OTHER): Payer: 59 | Admitting: Family Medicine

## 2016-11-02 ENCOUNTER — Encounter: Payer: Self-pay | Admitting: Family Medicine

## 2016-11-02 DIAGNOSIS — J069 Acute upper respiratory infection, unspecified: Secondary | ICD-10-CM | POA: Insufficient documentation

## 2016-11-02 MED ORDER — AMOXICILLIN-POT CLAVULANATE 875-125 MG PO TABS: 1.0000 | ORAL_TABLET | Freq: Two times a day (BID) | ORAL | 0 refills | Status: DC

## 2016-11-02 NOTE — Assessment & Plan Note (Signed)
Nontoxic, some better today.  D/w pt about supportive care.  If not better, than start augmentin.  Okay to hold rx for now.  He agrees.  See AVS.

## 2016-11-02 NOTE — Patient Instructions (Signed)
Drink plenty of fluids, take tylenol as needed, and gargle with warm salt water for your throat.  Warm compresses on your face as needed.  Use nasal saline.  This should gradually improve.  If not, then start the antibiotics.  Take care.  Let us know if you have other concerns.

## 2016-11-02 NOTE — Progress Notes (Signed)
Cough.  Sick contact.  Sx stated about 10 days ago.  Started with sinus drainage and throat irritation initially.  Started taking dayquil in the meantime, then mucinex, etc.  No help from meds.  No fevers recently, may have had a fever last week- he prev felt hot.  Still with cough, esp from post nasal gtt at night.  Some sputum, not a lot, discolored in the AM but clears as the day goes on.  No ear pain.  Facial pain B.  No tooth pain.  No vomiting, no diarrhea.  Fatigue noted.   Brother recent died.  I offered condolences.   Meds, vitals, and allergies reviewed.   ROS: Per HPI unless specifically indicated in ROS section   GEN: nad, alert and oriented HEENT: mucous membranes moist, tm w/o erythema, nasal exam w/o erythema, clear discharge noted,  OP with cobblestoning, sinuses not ttp NECK: supple w/o LA CV: rrr.   PULM: ctab, no inc wob EXT: no edema

## 2016-11-02 NOTE — Progress Notes (Signed)
Pre visit review using our clinic review tool, if applicable. No additional management support is needed unless otherwise documented below in the visit note. 

## 2016-11-06 ENCOUNTER — Ambulatory Visit (INDEPENDENT_AMBULATORY_CARE_PROVIDER_SITE_OTHER)
Admission: RE | Admit: 2016-11-06 | Discharge: 2016-11-06 | Disposition: A | Discharge status: Home / Self Care | Payer: Self-pay | Source: Ambulatory Visit | Attending: Cardiovascular Disease | Admitting: Cardiovascular Disease

## 2016-11-06 DIAGNOSIS — Z8249 Family history of ischemic heart disease and other diseases of the circulatory system: Secondary | ICD-10-CM

## 2016-11-06 DIAGNOSIS — R9431 Abnormal electrocardiogram [ECG] [EKG]: Secondary | ICD-10-CM

## 2016-11-06 DIAGNOSIS — Z7689 Persons encountering health services in other specified circumstances: Secondary | ICD-10-CM

## 2016-11-11 ENCOUNTER — Telehealth: Payer: Self-pay | Admitting: Cardiovascular Disease

## 2016-11-11 NOTE — Telephone Encounter (Signed)
Patient returned someone's call °

## 2016-11-11 NOTE — Telephone Encounter (Signed)
Called patient with CT results. Per Dr. Eden EmmsNishan, Calcium score not 0 but below average for age so keep up current dose of lipitor. Patient verbalized understanding.

## 2016-11-16 ENCOUNTER — Telehealth (HOSPITAL_COMMUNITY): Payer: Self-pay | Admitting: *Deleted

## 2016-11-16 NOTE — Telephone Encounter (Signed)
Patient given detailed instructions per Stress Test Requisition Sheet for test on 11/19/16 at 7:30.Patient Notified to arrive 30 minutes early, and that it is imperative to arrive on time for appointment to keep from having the test rescheduled.  Patient verbalized understanding. James DolinSharon S Brooks

## 2016-11-19 ENCOUNTER — Ambulatory Visit (HOSPITAL_COMMUNITY): Payer: 59 | Attending: Internal Medicine

## 2016-11-19 ENCOUNTER — Ambulatory Visit (HOSPITAL_COMMUNITY): Payer: 59

## 2016-11-19 DIAGNOSIS — Z7689 Persons encountering health services in other specified circumstances: Secondary | ICD-10-CM | POA: Diagnosis present

## 2016-11-19 DIAGNOSIS — R9431 Abnormal electrocardiogram [ECG] [EKG]: Secondary | ICD-10-CM

## 2016-11-19 DIAGNOSIS — Z8249 Family history of ischemic heart disease and other diseases of the circulatory system: Secondary | ICD-10-CM

## 2016-12-02 ENCOUNTER — Other Ambulatory Visit (INDEPENDENT_AMBULATORY_CARE_PROVIDER_SITE_OTHER): Payer: 59

## 2016-12-02 DIAGNOSIS — E119 Type 2 diabetes mellitus without complications: Secondary | ICD-10-CM | POA: Diagnosis not present

## 2016-12-02 LAB — HEMOGLOBIN A1C: Hgb A1c MFr Bld: 6.5 % (ref 4.6–6.5)

## 2016-12-04 ENCOUNTER — Encounter: Payer: Self-pay | Admitting: *Deleted

## 2016-12-29 ENCOUNTER — Telehealth: Payer: Self-pay

## 2016-12-29 MED ORDER — OSELTAMIVIR PHOSPHATE 75 MG PO CAPS: 75.0000 mg | ORAL_CAPSULE | Freq: Two times a day (BID) | ORAL | 0 refills | Status: DC

## 2016-12-29 NOTE — Telephone Encounter (Signed)
Sent.  Thanks.  F/u as needed.  Should get a flu shot if not already done this season.  t

## 2016-12-29 NOTE — Telephone Encounter (Signed)
Pt left v/m; pt saw grandchildren on 12/28/16 and they have the flu; dxed at their doctors office; last night at 11pm pt began to feel bad with shivers and chills, pt did not take temp last night; this morning fever 99.6 after tylenol; no S/T or earache or cough but still achy and chilling this morning. Pt request tamiflu to shorten flu symptoms.[facility]

## 2016-12-29 NOTE — Telephone Encounter (Signed)
Patient notified as instructed by telephone and verbalized understanding. Patient stated that he had a flu shot at work. EMR documented with flu shot date.

## 2017-08-05 ENCOUNTER — Other Ambulatory Visit: Payer: Self-pay | Admitting: Family Medicine

## 2017-09-17 ENCOUNTER — Other Ambulatory Visit: Payer: Self-pay | Admitting: Family Medicine

## 2017-09-17 ENCOUNTER — Other Ambulatory Visit (INDEPENDENT_AMBULATORY_CARE_PROVIDER_SITE_OTHER): Payer: 59

## 2017-09-17 DIAGNOSIS — E119 Type 2 diabetes mellitus without complications: Secondary | ICD-10-CM | POA: Diagnosis not present

## 2017-09-17 LAB — MICROALBUMIN / CREATININE URINE RATIO
Creatinine,U: 141 mg/dL
MICROALB/CREAT RATIO: 0.5 mg/g (ref 0.0–30.0)
Microalb, Ur: <0.7 mg/dL (ref 0.0–1.9)

## 2017-09-17 LAB — COMPREHENSIVE METABOLIC PANEL
ALBUMIN: 4.1 g/dL (ref 3.5–5.2)
ALK PHOS: 56 U/L (ref 39–117)
ALT: 35 U/L (ref 0–53)
AST: 22 U/L (ref 0–37)
BILIRUBIN TOTAL: 0.9 mg/dL (ref 0.2–1.2)
BUN: 16 mg/dL (ref 6–23)
CO2: 31 mEq/L (ref 19–32)
Calcium: 9.4 mg/dL (ref 8.4–10.5)
Chloride: 97 mEq/L (ref 96–112)
Creatinine, Ser: 0.99 mg/dL (ref 0.40–1.50)
GFR: 81.5 mL/min (ref >60.00)
Glucose, Bld: 143 mg/dL — ABNORMAL HIGH (ref 70–99)
POTASSIUM: 3.9 meq/L (ref 3.5–5.1)
Sodium: 137 mEq/L (ref 135–145)
TOTAL PROTEIN: 7.2 g/dL (ref 6.0–8.3)

## 2017-09-17 LAB — LIPID PANEL
CHOLESTEROL: 163 mg/dL (ref 0–200)
HDL: 38.1 mg/dL — AB (ref >39.00)
LDL Cholesterol: 96 mg/dL (ref 0–99)
NONHDL: 124.53
Total CHOL/HDL Ratio: 4
Triglycerides: 142 mg/dL (ref 0.0–149.0)
VLDL: 28.4 mg/dL (ref 0.0–40.0)

## 2017-09-17 LAB — HEMOGLOBIN A1C: HEMOGLOBIN A1C: 6.9 % — AB (ref 4.6–6.5)

## 2017-09-27 ENCOUNTER — Encounter: Payer: Self-pay | Admitting: Family Medicine

## 2017-09-27 ENCOUNTER — Ambulatory Visit (INDEPENDENT_AMBULATORY_CARE_PROVIDER_SITE_OTHER): Payer: 59 | Admitting: Family Medicine

## 2017-09-27 VITALS — BP 130/76 | HR 69 | Temp 98.6°F | Ht 67.0 in | Wt 221.2 lb

## 2017-09-27 DIAGNOSIS — E785 Hyperlipidemia, unspecified: Secondary | ICD-10-CM

## 2017-09-27 DIAGNOSIS — Z23 Encounter for immunization: Secondary | ICD-10-CM

## 2017-09-27 DIAGNOSIS — I10 Essential (primary) hypertension: Secondary | ICD-10-CM

## 2017-09-27 DIAGNOSIS — Z7189 Other specified counseling: Secondary | ICD-10-CM

## 2017-09-27 DIAGNOSIS — E119 Type 2 diabetes mellitus without complications: Secondary | ICD-10-CM

## 2017-09-27 DIAGNOSIS — Z0001 Encounter for general adult medical examination with abnormal findings: Secondary | ICD-10-CM

## 2017-09-27 MED ORDER — ATENOLOL-CHLORTHALIDONE 50-25 MG PO TABS: 0.5000 | ORAL_TABLET | Freq: Every day | ORAL | 3 refills | Status: DC

## 2017-09-27 MED ORDER — ATORVASTATIN CALCIUM 40 MG PO TABS: 40.0000 mg | ORAL_TABLET | Freq: Every day | ORAL | 3 refills | Status: DC

## 2017-09-27 NOTE — Progress Notes (Signed)
CPE- See plan.  Routine anticipatory guidance given to patient.  See health maintenance.  The possibility exists that previously documented standard health maintenance information may have been brought forward from a previous encounter into this note.  If needed, that same information has been updated to reflect the current situation based on today's encounter.    Tetanus 2013  Flu done at work.   08/19/17 PNA 2018 Shingles d/w pt.  See AVS.  Colonoscopy due.  D/w pt.  Prostate cancer screening and PSA options (with potential risks and benefits of testing vs not testing) were discussed along with recent recs/guidelines.  He declined testing PSA at this point. Living will d/w pt. Wife designated if patient were incapacitated.  Diet and exercise d/w pt, esp about cutting out carbs.   Hypertension:    Using medication without problems or lightheadedness: yes Chest pain with exertion:no Edema:no Short of breath:no  Elevated Cholesterol: Using medications without problems: yes Muscle aches: no Diet compliance: encouraged Exercise:encouraged Labs d/w pt.   Diabetes:  No meds Hypoglycemic episodes: no sx, unless prolonged fasting.   Hyperglycemic episodes: no sx Feet problems: no Blood Sugars averaging: not checked.   eye exam within last year: encouraged, he'll call to schedule.   More carbs in the diet recently, with extended family living with him. Labs d/w pt.     PMH and SH reviewed  Meds, vitals, and allergies reviewed.   ROS: Per HPI.  Unless specifically indicated otherwise in HPI, the patient denies:  General: fever. Eyes: acute vision changes ENT: sore throat Cardiovascular: chest pain Respiratory: SOB GI: vomiting GU: dysuria Musculoskeletal: acute back pain Derm: acute rash Neuro: acute motor dysfunction Psych: worsening mood Endocrine: polydipsia Heme: bleeding Allergy: hayfever  GEN: nad, alert and oriented HEENT: mucous membranes moist NECK: supple  w/o LA CV: rrr. PULM: ctab, no inc wob ABD: soft, +bs EXT: no edema SKIN: no acute rash  Diabetic foot exam: Normal inspection No skin breakdown No calluses  Normal DP pulses Normal sensation to light touch and monofilament Nails normal

## 2017-09-27 NOTE — Patient Instructions (Addendum)
Check with your insurance to see if they will cover the shingrix shot.  Call about a colonoscopy and an eye appointment.  Let us know if you need help getting scheduled.   Work on diet and exercise, cut back on carbs.  Recheck in about 3-4 months, labs ahead of time.  Take care.  Glad to see you.

## 2017-09-28 NOTE — Assessment & Plan Note (Signed)
Living will d/w pt.  Wife designated if patient were incapacitated.   ?

## 2017-09-28 NOTE — Assessment & Plan Note (Signed)
No change in meds. Labs discussed with patient. Needs work on diet and exercise. 

## 2017-09-28 NOTE — Assessment & Plan Note (Signed)
Tetanus 2013  Flu done at work.   08/19/17 PNA 2018 Shingles d/w pt.  See AVS.  Colonoscopy due.  D/w pt.  Prostate cancer screening and PSA options (with potential risks and benefits of testing vs not testing) were discussed along with recent recs/guidelines.  He declined testing PSA at this point. Living will d/w pt. Wife designated if patient were incapacitated.  Diet and exercise d/w pt, esp about cutting out carbs.

## 2017-09-28 NOTE — Assessment & Plan Note (Signed)
No new meds at this point. Labs discussed with patient. Needs work on diet and exercise.  Recheck in about 3-4 months. He agrees.

## 2017-09-28 NOTE — Assessment & Plan Note (Signed)
No change in meds. Labs discussed with patient. Needs work on diet and exercise.

## 2017-09-29 ENCOUNTER — Other Ambulatory Visit: Payer: Self-pay | Admitting: Family Medicine

## 2017-09-29 ENCOUNTER — Telehealth: Payer: Self-pay | Admitting: Family Medicine

## 2017-09-29 DIAGNOSIS — Z1211 Encounter for screening for malignant neoplasm of colon: Secondary | ICD-10-CM

## 2017-09-29 NOTE — Progress Notes (Unsigned)
Referral placed. Thanks.  [facility].

## 2017-09-29 NOTE — Telephone Encounter (Signed)
Ordered. Thanks

## 2017-09-29 NOTE — Telephone Encounter (Signed)
Copied from CRM #[id number]. [phone]: Referral - Request >> Sep 27, 2017 11:47 AM Windy KalataMichael, Taylor L, NT wrote: Reason for CRM: pt needs to schedule a colonoscopy. Says he is past due.

## 2017-09-30 ENCOUNTER — Encounter: Payer: Self-pay | Admitting: Internal Medicine

## 2017-11-01 DIAGNOSIS — E119 Type 2 diabetes mellitus without complications: Secondary | ICD-10-CM | POA: Diagnosis not present

## 2017-11-01 LAB — HM DIABETES EYE EXAM

## 2017-11-16 ENCOUNTER — Encounter: Payer: Self-pay | Admitting: Family Medicine

## 2017-11-26 ENCOUNTER — Ambulatory Visit (AMBULATORY_SURGERY_CENTER): Payer: Self-pay | Admitting: *Deleted

## 2017-11-26 ENCOUNTER — Other Ambulatory Visit: Payer: Self-pay

## 2017-11-26 VITALS — Ht 68.0 in | Wt 225.0 lb

## 2017-11-26 DIAGNOSIS — Z1211 Encounter for screening for malignant neoplasm of colon: Secondary | ICD-10-CM

## 2017-11-26 NOTE — Progress Notes (Signed)
No egg or soy allergy known to patient  No issues with past sedation with any surgeries  or procedures, no intubation problems  No diet pills per patient No home 02 use per patient  No blood thinners per patient  Pt denies issues with constipation  No A fib or A flutter  EMMI video sent to pt's e mail pt declined   

## 2017-12-07 ENCOUNTER — Encounter: Payer: Self-pay | Admitting: Internal Medicine

## 2017-12-13 ENCOUNTER — Ambulatory Visit (AMBULATORY_SURGERY_CENTER): Payer: 59 | Admitting: Internal Medicine

## 2017-12-13 ENCOUNTER — Other Ambulatory Visit: Payer: Self-pay

## 2017-12-13 ENCOUNTER — Encounter: Payer: Self-pay | Admitting: Internal Medicine

## 2017-12-13 VITALS — BP 152/85 | HR 61 | Temp 97.8°F | Resp 11 | Ht 67.0 in | Wt 221.0 lb

## 2017-12-13 DIAGNOSIS — Z1212 Encounter for screening for malignant neoplasm of rectum: Secondary | ICD-10-CM | POA: Diagnosis not present

## 2017-12-13 DIAGNOSIS — Z1211 Encounter for screening for malignant neoplasm of colon: Secondary | ICD-10-CM

## 2017-12-13 MED ORDER — SODIUM CHLORIDE 0.9 % IV SOLN: 500.0000 mL | INTRAVENOUS | Status: DC

## 2017-12-13 NOTE — Progress Notes (Signed)
To PACU, VSS. Report to RN.tb 

## 2017-12-13 NOTE — Progress Notes (Signed)
Pt. Reports no change in his medical or surgical history since his pre-visit 11/26/2017.

## 2017-12-13 NOTE — Patient Instructions (Addendum)
No polyps or cancer seen!  You also have a condition called diverticulosis - common and not usually a problem. Please read the handout provided.  Next routine colonoscopy or other screening test in 10 years - 2029.  I appreciate the opportunity to care for you. Iva Booparl E. Gessner, MD, [facility]   Discharge instructions given. Handout on diverticulosis. Resume previous medications. YOU HAD AN ENDOSCOPIC PROCEDURE TODAY AT THE [facility]:   Refer to the procedure report that was given to you for any specific questions about what was found during the examination.  If the procedure report does not answer your questions, please call your gastroenterologist to clarify.  If you requested that your care partner not be given the details of your procedure findings, then the procedure report has been included in a sealed envelope for you to review at your convenience later.  YOU SHOULD EXPECT: Some feelings of bloating in the abdomen. Passage of more gas than usual.  Walking can help get rid of the air that was put into your GI tract during the procedure and reduce the bloating. If you had a lower endoscopy (such as a colonoscopy or flexible sigmoidoscopy) you may notice spotting of blood in your stool or on the toilet paper. If you underwent a bowel prep for your procedure, you may not have a normal bowel movement for a few days.  Please Note:  You might notice some irritation and congestion in your nose or some drainage.  This is from the oxygen used during your procedure.  There is no need for concern and it should clear up in a day or so.  SYMPTOMS TO REPORT IMMEDIATELY:   Following lower endoscopy (colonoscopy or flexible sigmoidoscopy):  Excessive amounts of blood in the stool  Significant tenderness or worsening of abdominal pains  Swelling of the abdomen that is new, acute  Fever of 100F or higher   For urgent or emergent issues, a gastroenterologist can be reached at any  hour by calling [phone].   DIET:  We do recommend a small meal at first, but then you may proceed to your regular diet.  Drink plenty of fluids but you should avoid alcoholic beverages for 24 hours.  ACTIVITY:  You should plan to take it easy for the rest of today and you should NOT DRIVE or use heavy machinery until tomorrow (because of the sedation medicines used during the test).    FOLLOW UP: Our staff will call the number listed on your records the next business day following your procedure to check on you and address any questions or concerns that you may have regarding the information given to you following your procedure. If we do not reach you, we will leave a message.  However, if you are feeling well and you are not experiencing any problems, there is no need to return our call.  We will assume that you have returned to your regular daily activities without incident.  If any biopsies were taken you will be contacted by phone or by letter within the next 1-3 weeks.  Please call us at [phone] if you have not heard about the biopsies in 3 weeks.    SIGNATURES/CONFIDENTIALITY: You and/or your care partner have signed paperwork which will be entered into your electronic medical record.  These signatures attest to the fact that that the information above on your After Visit Summary has been reviewed and is understood.  Full responsibility of the confidentiality  of this discharge information lies with you and/or your care-partner.

## 2017-12-13 NOTE — Op Note (Signed)
[facility] Patient Name: James Weaver Procedure Date: 12/13/2017 9:46 AM MRN: [id number] Endoscopist: Iva Boop , MD Age: 62 Referring MD:  Date of Birth: Oct 17, 1956 Gender: Male Account #: [id number] Procedure:                Colonoscopy Indications:              Screening for colorectal malignant neoplasm Medicines:                Propofol per Anesthesia, Monitored Anesthesia Care Procedure:                Pre-Anesthesia Assessment:                           - Prior to the procedure, a History and Physical                            was performed, and patient medications and                            allergies were reviewed. The patient's tolerance of                            previous anesthesia was also reviewed. The risks                            and benefits of the procedure and the sedation                            options and risks were discussed with the patient.                            All questions were answered, and informed consent                            was obtained. Prior Anticoagulants: The patient has                            taken no previous anticoagulant or antiplatelet                            agents. ASA Grade Assessment: III - A patient with                            severe systemic disease. After reviewing the risks                            and benefits, the patient was deemed in                            satisfactory condition to undergo the procedure.                           After obtaining informed consent, the colonoscope  was passed under direct vision. Throughout the                            procedure, the patient's blood pressure, pulse, and                            oxygen saturations were monitored continuously. The                            Colonoscope was introduced through the anus and                            advanced to the the cecum, identified by   appendiceal orifice and ileocecal valve. The                            ileocecal valve, appendiceal orifice, and rectum                            were photographed. The quality of the bowel                            preparation was good. The bowel preparation used                            was Miralax. Scope In: 10:00:05 AM Scope Out: 10:17:34 AM Scope Withdrawal Time: 0 hours 15 minutes 13 seconds  Total Procedure Duration: 0 hours 17 minutes 29 seconds  Findings:                 The perianal and digital rectal examinations were                            normal. Pertinent negatives include normal prostate                            (size, shape, and consistency).                           Multiple diverticula were found in the sigmoid                            colon.                           The exam was otherwise without abnormality on                            direct and retroflexion views. Complications:            No immediate complications. Estimated blood loss:                            None. Estimated Blood Loss:     Estimated blood loss: none. Recommendation:           - Repeat colonoscopy in 10 years for screening  purposes.                           - Resume previous diet.                           - Continue present medications. Iva Booparl E Gloriann Riede, MD 12/13/2017 10:23:55 AM This report has been signed electronically.

## 2017-12-14 ENCOUNTER — Telehealth: Payer: Self-pay | Admitting: *Deleted

## 2017-12-14 NOTE — Telephone Encounter (Signed)
No answer for second post procedure call back. No answering machine available.SM

## 2017-12-14 NOTE — Telephone Encounter (Signed)
Patient calling back states he is doing fine after colonoscopy.

## 2017-12-14 NOTE — Telephone Encounter (Signed)
Message left

## 2017-12-20 ENCOUNTER — Other Ambulatory Visit: Payer: Self-pay | Admitting: Family Medicine

## 2017-12-20 DIAGNOSIS — E119 Type 2 diabetes mellitus without complications: Secondary | ICD-10-CM

## 2017-12-20 NOTE — Progress Notes (Signed)
u

## 2017-12-23 ENCOUNTER — Other Ambulatory Visit (INDEPENDENT_AMBULATORY_CARE_PROVIDER_SITE_OTHER): Payer: 59

## 2017-12-23 DIAGNOSIS — E119 Type 2 diabetes mellitus without complications: Secondary | ICD-10-CM

## 2017-12-23 LAB — HEMOGLOBIN A1C: Hgb A1c MFr Bld: 7 % — ABNORMAL HIGH (ref 4.6–6.5)

## 2017-12-28 ENCOUNTER — Encounter: Payer: Self-pay | Admitting: Family Medicine

## 2017-12-28 ENCOUNTER — Ambulatory Visit: Payer: 59 | Admitting: Family Medicine

## 2017-12-28 VITALS — BP 116/68 | HR 91 | Temp 98.6°F | Wt 221.2 lb

## 2017-12-28 DIAGNOSIS — E119 Type 2 diabetes mellitus without complications: Secondary | ICD-10-CM | POA: Diagnosis not present

## 2017-12-28 NOTE — Patient Instructions (Addendum)
We will call about your referral.  Check to make sure it is covered by your insurance.  Recheck labs in about 3-4 months prior to a visit.  Get on the treadmill for 20-30 minutes at a time 3 times a week.  Take care.  Glad to see you.

## 2017-12-28 NOTE — Progress Notes (Signed)
Diabetes:  No meds.   Hypoglycemic episodes: no Hyperglycemic episodes: no Feet problems: no Blood Sugars averaging: not checked.   eye exam within last year: yes A1c stable, but not improved.  D/w pt.  Diet and exercise d/w pt.  He isn't exercising as much as he would like, but he has tried more in the last few weeks.  He has been cutting wood at home.  Diet- "I try not to eat sugar but I love breat and rice and potatoes."  He got wheat bread, etc.  He isn't drinking soda.    He has some R lower back soreness w/o weakness after cutting wood.  D/w pt about stretching and not sitting for prolonged times.    He has a treadmill at home.  D/w pt about use. He can watch a video while walking.    His father in law was diabetic and had complications, d/w pt.  The goal is to prevent complications, dw pt.    He thinks talking with nutrition would help.  We have talked about low sugar diets/handouts prev.    Meds, vitals, and allergies reviewed.   ROS: Per HPI unless specifically indicated in ROS section   GEN: nad, alert and oriented HEENT: mucous membranes moist NECK: supple w/o LA CV: rrr. PULM: ctab, no inc wob ABD: soft, +bs EXT: no edema

## 2017-12-29 NOTE — Assessment & Plan Note (Signed)
Technically he has controlled diabetes with an A1c of 7 and is not on medication for diabetes, but it would be better if he could work on his weight and diet and exercise routine so that he was not diabetic in the first place.  Discussed with patient.  Refer for nutrition counseling. Recheck labs in about 3-4 months prior to a visit.  Get on the treadmill for 20-30 minutes at a time 3 times a week.  All discussed.   He agrees.

## 2018-01-11 ENCOUNTER — Encounter: Payer: Self-pay | Admitting: Registered"

## 2018-01-11 ENCOUNTER — Encounter: Payer: 59 | Attending: Family Medicine | Admitting: Registered"

## 2018-01-11 DIAGNOSIS — E119 Type 2 diabetes mellitus without complications: Secondary | ICD-10-CM | POA: Diagnosis present

## 2018-01-11 DIAGNOSIS — Z713 Dietary counseling and surveillance: Secondary | ICD-10-CM | POA: Diagnosis not present

## 2018-01-11 NOTE — Progress Notes (Signed)
Diabetes Self-Management Education  Visit Type: First/Initial  Appt. Start Time: 0800 Appt. End Time: 0930  01/11/2018  Mr. James Weaver, identified by name and date of birth, is a 62 y.o. male with a diagnosis of Diabetes: Type 2.   ASSESSMENT Patient states his A1c has stayed below 7% for several years and would like to learn what he can do to get it back down below 7%. Patient reports episodes of symptoms consistent with low blood sugar. RD reviewed hypoglycemia treatment.  Patient states his wife also has elevated blood sugar so she is vigilant about cooking meals that are low in carbohydrates and typically doesn't keep snack foods such as chips in the house unless grandkids are visiting. Patient states his wife is interested in cooking methods for healthier eating.  Patient states he works from home and has a stressful job, but feels he handles stress pretty well and rates level 5 out of 10. 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  Diabetes Self-Management Education - 01/11/18 0811      Visit Information   Visit Type  First/Initial      Initial Visit   Diabetes Type  Type 2    Are you currently following a meal plan?  No    Are you taking your medications as prescribed?  Not on Medications    Date Diagnosed  1 year      Health Coping   How would you rate your overall health?  Fair      Psychosocial Assessment   Patient Belief/Attitude about Diabetes  Motivated to manage diabetes    How often do you need to have someone help you when you read instructions, pamphlets, or other written materials from your doctor or pharmacy?  1 - Never    What is the last grade level you completed in school?  1 yr college      Complications   Last HgB A1C per patient/outside source  7 %    How often do you check your blood  sugar?  0 times/day (not testing)    Have you had a dilated eye exam in the past 12 months?  Yes    Have you had a dental exam in the past 12 months?  Yes    Are you checking your feet?  No      Dietary Intake   Breakfast  2 eggs, sausage, OJ 8oz    Snack (morning)  none OR peanuts honey roasted    Lunch  ham sandwich, chips OR leftovers OR [employer], side salad, tea w/ splenda    Snack (afternoon)  peanut OR fruit    Dinner  chicken stir-fry    Snack (evening)  iced cookies OR dry cereal    Beverage(s)  diet green tea, OJ      Exercise   Exercise Type  Moderate (swimming / aerobic walking)    How many days per week to you exercise?  3    How many minutes per day do you exercise?  30    Total minutes per week of exercise  90      Patient Education   Disease state   Definition of diabetes, type 1 and 2, and the diagnosis of diabetes    Nutrition management   Role of diet  in the treatment of diabetes and the relationship between the three main macronutrients and blood glucose level;Food label reading, portion sizes and measuring food.    Physical activity and exercise   Role of exercise on diabetes management, blood pressure control and cardiac health.    Monitoring  Purpose and frequency of SMBG.;Identified appropriate SMBG and/or A1C goals.    Acute complications  Taught treatment of hypoglycemia - the 15 rule.    Chronic complications  Relationship between chronic complications and blood glucose control;Lipid levels, blood glucose control and heart disease    Psychosocial adjustment  Role of stress on diabetes      Individualized Goals (developed by patient)   Nutrition  General guidelines for healthy choices and portions discussed    Physical Activity  Exercise 3-5 times per week      Outcomes   Expected Outcomes  Demonstrated interest in learning. Expect positive outcomes    Future DMSE  PRN    Program Status  Completed     Individualized Plan for Diabetes Self-Management  Training:   Learning Objective:  Patient will have a greater understanding of diabetes self-management. Patient education plan is to attend individual and/or group sessions per assessed needs and concerns.  Patient Instructions  Plan:  Include protein with your meals and snacks that have carbohydates Aim for 3 balanced meals per day, with not to heavy of a supper meal and not too late. Consider increasing your activity level daily as tolerated, with a goal of 3x at 45 min or 5x at 30 min. Consider checking blood sugar at alternate times per as desired to check effect of your diet and lifestyle choices. Creating some time/space boundaries on work. Call insurance company to see what meter they cover. [employer] offers a free meter on their website.  Expected Outcomes:  Demonstrated interest in learning. Expect positive outcomes  Education material provided: Living Well with Diabetes and A1C conversion sheet, [employer] information sheets from their website  If problems or questions, patient to contact team via:  Phone and MyChart  Future DSME appointment: PRN

## 2018-01-11 NOTE — Patient Instructions (Addendum)
Plan:  Include protein with your meals and snacks that have carbohydates Aim for 3 balanced meals per day, with not to heavy of a supper meal and not too late. Consider increasing your activity level daily as tolerated, with a goal of 3x at 45 min or 5x at 30 min. Consider checking blood sugar at alternate times per as desired to check effect of your diet and lifestyle choices. Creating some time/space boundaries on work. Call insurance company to see what meter they cover. [employer] offers a free meter on their website.

## 2018-03-04 ENCOUNTER — Other Ambulatory Visit: Payer: Self-pay | Admitting: *Deleted

## 2018-03-24 IMAGING — CT CT HEART SCORING
2 series · 16 of 20 positions shown, 18 images · non-contrast
Comparison: None.

CLINICAL DATA: Risk stratification

EXAM:
Coronary Calcium Score
TECHNIQUE: The patient was scanned on a Siemens Sensation 16 slice scanner.
Axial non-contrast 3mm slices were carried out through the heart.
The data set was analyzed on a dedicated work station and scored
using the Agatson method.

[Series 2: casc 3.0 i36f 2 bestdiast 67 % · axial · 0.35mm/px · z∈[-226,-142]mm · 8 of 37 slices shown, 10 images]
[im 5/37  vessel]
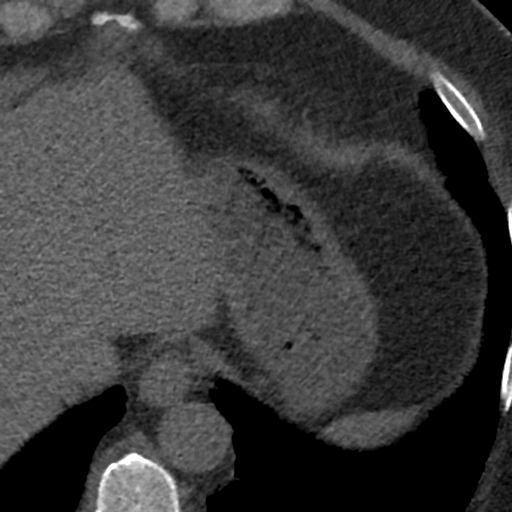
[im 5/37  lung]
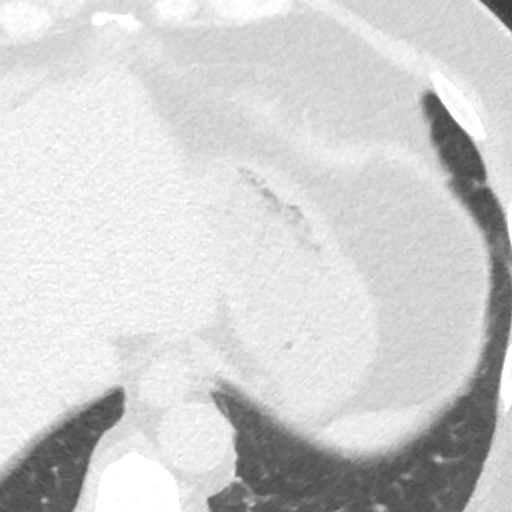
[im 9/37  vessel]
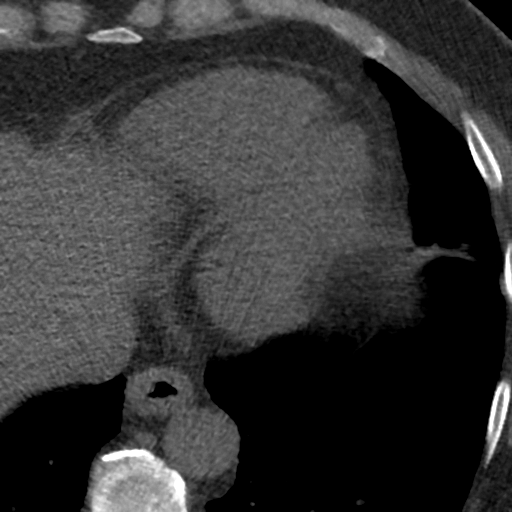
[im 13/37  vessel]
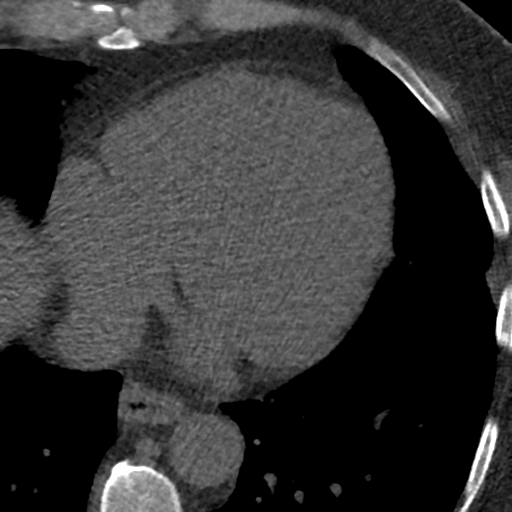
[im 17/37  vessel]
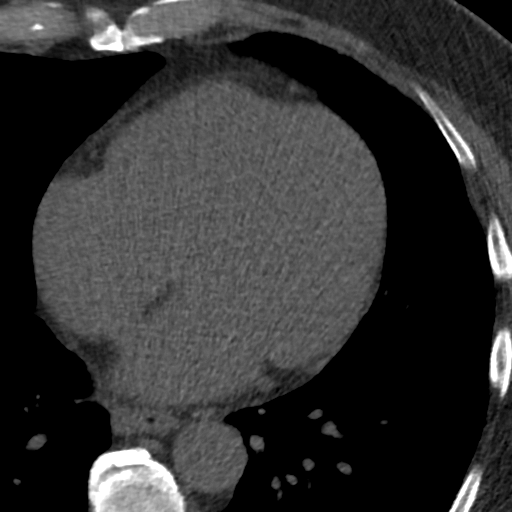
[im 21/37  vessel]
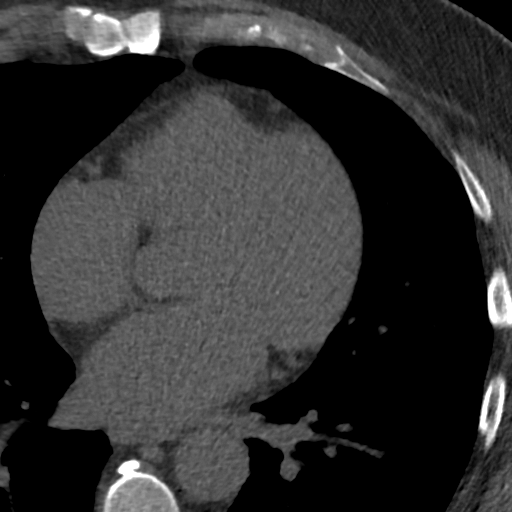
[im 21/37  lung]
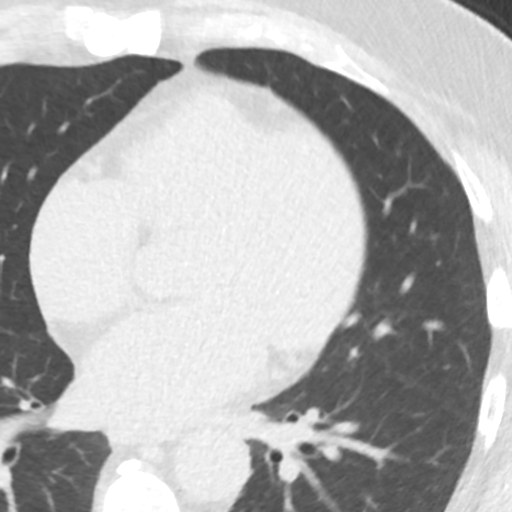
[im 25/37  vessel]
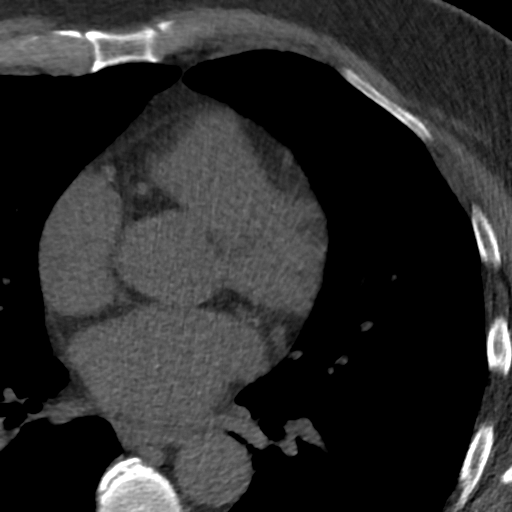
[im 29/37  vessel]
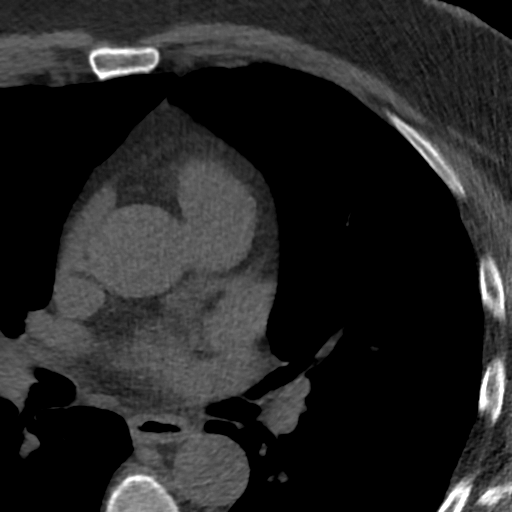
[im 33/37  vessel]
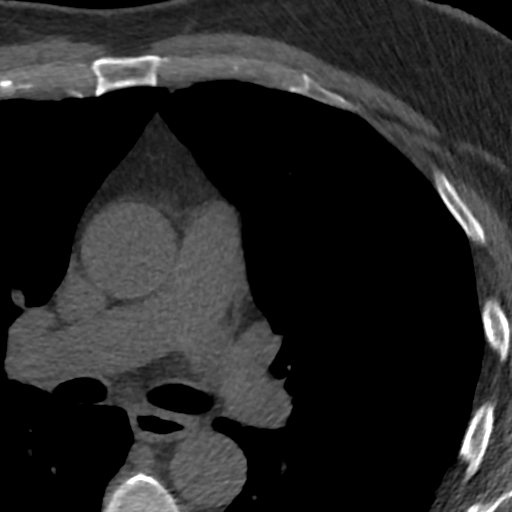

[Series 4: lung st 67 % · axial · 0.68mm/px · z∈[-228,-144]mm · 8 of 38 slices shown]
[im 5/38  lung]
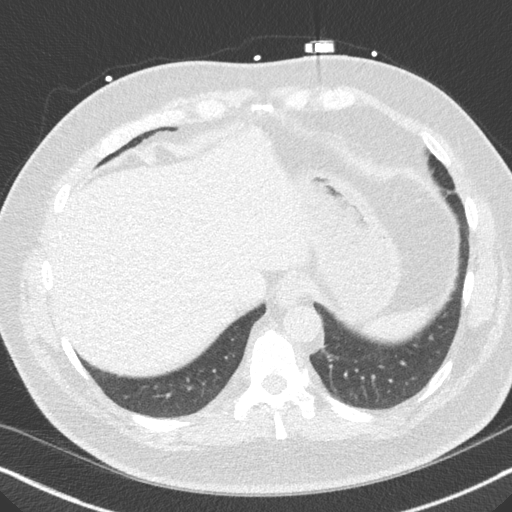
[im 9/38  lung]
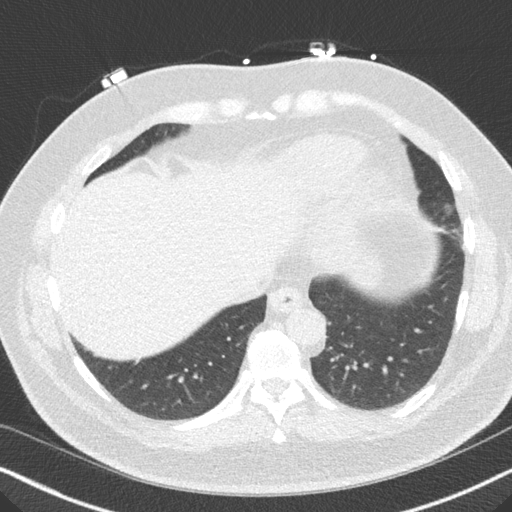
[im 13/38  lung]
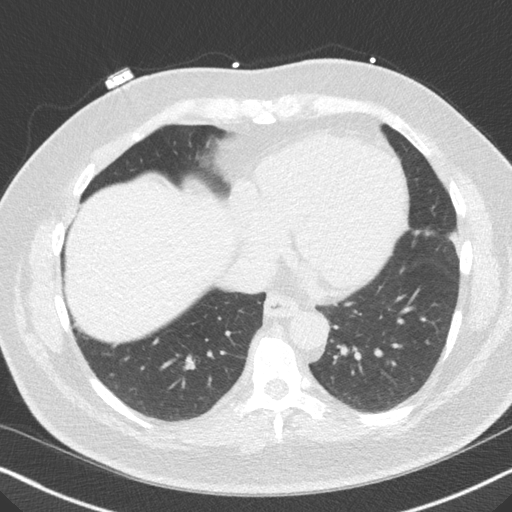
[im 17/38  lung]
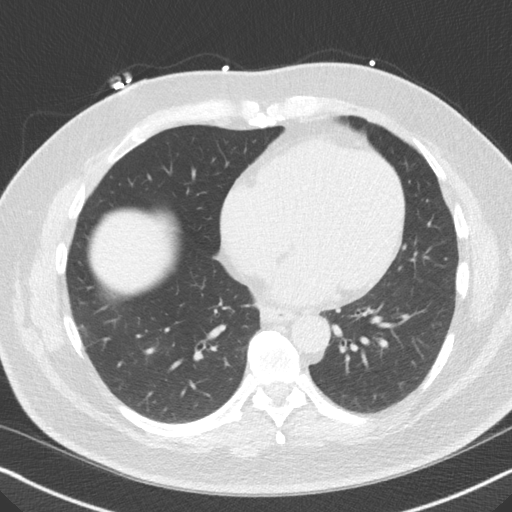
[im 21/38  lung]
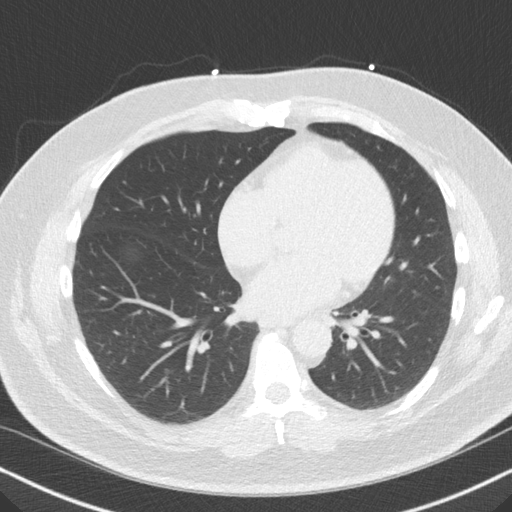
[im 25/38  lung]
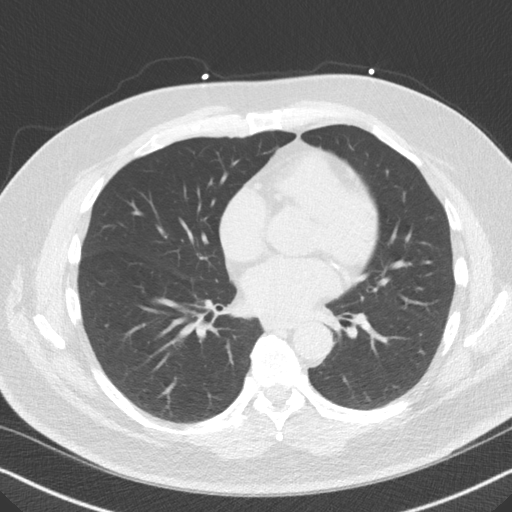
[im 29/38  lung]
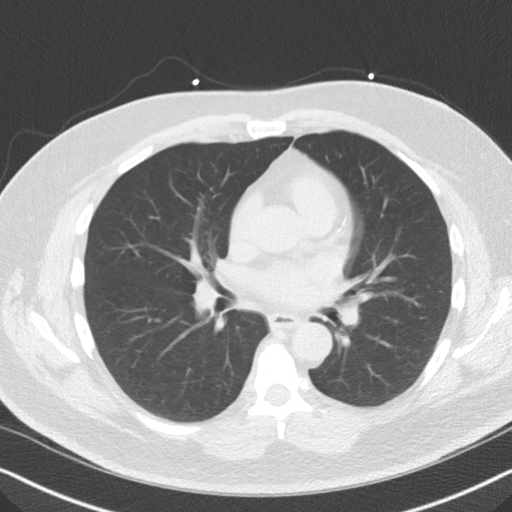
[im 33/38  lung]
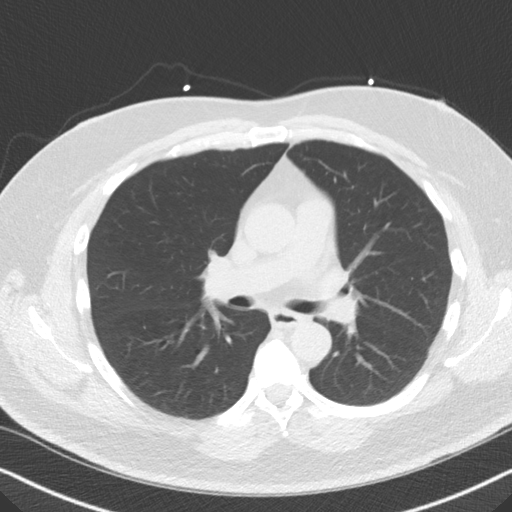

[16 of 20 positions shown; findings below may reference images not displayed]

FINDINGS: Non-cardiac: No significant non cardiac findings on limited lung and
soft tissue windows. See separate report from [REDACTED].

Ascending Aorta:  3.4 cm

Pericardium: Normal

Coronary arteries: Mild calcium in proximal RCA and proximal and mid
LAD
IMPRESSION: Coronary calcium score of 41. This was 45th percentile for age and
sex matched control.

Sebem Monteiro

EXAM:
OVER-READ INTERPRETATION  CT CHEST

The following report is an over-read performed by radiologist Dr.
Nikky Session [REDACTED] on 11/06/2016. This over-read
does not include interpretation of cardiac or coronary anatomy or
pathology. The coronary calcium score interpretation by the
cardiologist is attached.
FINDINGS: Cardiovascular: Heart is normal size. Aorta is normal caliber.

Mediastinum/Nodes: No adenopathy in the visualized lower mediastinum
or hila.

Lungs/Pleura: Linear density in the lingula posteriorly, likely
scarring. Visualized lungs otherwise clear. No effusions.

Upper Abdomen: Imaging into the upper abdomen shows no acute
findings.

Musculoskeletal: No chest wall mass or suspicious bone lesions
identified.
IMPRESSION: No acute or significant extracardiac abnormality.

## 2018-10-17 ENCOUNTER — Other Ambulatory Visit: Payer: Self-pay | Admitting: Family Medicine

## 2018-10-18 ENCOUNTER — Other Ambulatory Visit: Payer: Self-pay | Admitting: Family Medicine

## 2018-11-08 DIAGNOSIS — E119 Type 2 diabetes mellitus without complications: Secondary | ICD-10-CM | POA: Diagnosis not present

## 2018-11-08 LAB — HM DIABETES EYE EXAM

## 2018-11-24 ENCOUNTER — Encounter: Payer: Self-pay | Admitting: Family Medicine

## 2018-12-06 ENCOUNTER — Ambulatory Visit: Payer: BLUE CROSS/BLUE SHIELD | Admitting: Family Medicine

## 2018-12-06 ENCOUNTER — Encounter: Payer: Self-pay | Admitting: Family Medicine

## 2018-12-06 VITALS — BP 120/80 | HR 78 | Temp 99.1°F | Ht 67.0 in | Wt 222.2 lb

## 2018-12-06 DIAGNOSIS — J324 Chronic pansinusitis: Secondary | ICD-10-CM

## 2018-12-06 DIAGNOSIS — R509 Fever, unspecified: Secondary | ICD-10-CM | POA: Diagnosis not present

## 2018-12-06 DIAGNOSIS — R52 Pain, unspecified: Secondary | ICD-10-CM | POA: Diagnosis not present

## 2018-12-06 LAB — POC INFLUENZA A&B (BINAX/QUICKVUE)
INFLUENZA B, POC: NEGATIVE
Influenza A, POC: NEGATIVE

## 2018-12-06 MED ORDER — FLUTICASONE PROPIONATE 50 MCG/ACT NA SUSP: 2.0000 | Freq: Every day | NASAL | 6 refills | Status: DC

## 2018-12-06 MED ORDER — CEFDINIR 300 MG PO CAPS: 300.0000 mg | ORAL_CAPSULE | Freq: Two times a day (BID) | ORAL | 0 refills | Status: DC

## 2018-12-06 NOTE — Patient Instructions (Signed)
Sinusitis, Adult  Sinusitis is inflammation of your sinuses. Sinuses are hollow spaces in the bones around your face. Your sinuses are located:   Around your eyes.   In the middle of your forehead.   Behind your nose.   In your cheekbones.  Mucus normally drains out of your sinuses. When your nasal tissues become inflamed or swollen, mucus can become trapped or blocked. This allows bacteria, viruses, and fungi to grow, which leads to infection. Most infections of the sinuses are caused by a virus.  Sinusitis can develop quickly. It can last for up to 4 weeks (acute) or for more than 12 weeks (chronic). Sinusitis often develops after a cold.  What are the causes?  This condition is caused by anything that creates swelling in the sinuses or stops mucus from draining. This includes:   Allergies.   Asthma.   Infection from bacteria or viruses.   Deformities or blockages in your nose or sinuses.   Abnormal growths in the nose (nasal polyps).   Pollutants, such as chemicals or irritants in the air.   Infection from fungi (rare).  What increases the risk?  You are more likely to develop this condition if you:   Have a weak body defense system (immune system).   Do a lot of swimming or diving.   Overuse nasal sprays.   Smoke.  What are the signs or symptoms?  The main symptoms of this condition are pain and a feeling of pressure around the affected sinuses. Other symptoms include:   Stuffy nose or congestion.   Thick drainage from your nose.   Swelling and warmth over the affected sinuses.   Headache.   Upper toothache.   A cough that may get worse at night.   Extra mucus that collects in the throat or the back of the nose (postnasal drip).   Decreased sense of smell and taste.   Fatigue.   A fever.   Sore throat.   Bad breath.  How is this diagnosed?  This condition is diagnosed based on:   Your symptoms.   Your medical history.   A physical exam.   Tests to find out if your condition is  acute or chronic. This may include:  ? Checking your nose for nasal polyps.  ? Viewing your sinuses using a device that has a light (endoscope).  ? Testing for allergies or bacteria.  ? Imaging tests, such as an MRI or CT scan.  In rare cases, a bone biopsy may be done to rule out more serious types of fungal sinus disease.  How is this treated?  Treatment for sinusitis depends on the cause and whether your condition is chronic or acute.   If caused by a virus, your symptoms should go away on their own within 10 days. You may be given medicines to relieve symptoms. They include:  ? Medicines that shrink swollen nasal passages (topical intranasal decongestants).  ? Medicines that treat allergies (antihistamines).  ? A spray that eases inflammation of the nostrils (topical intranasal corticosteroids).  ? Rinses that help get rid of thick mucus in your nose (nasal saline washes).   If caused by bacteria, your health care provider may recommend waiting to see if your symptoms improve. Most bacterial infections will get better without antibiotic medicine. You may be given antibiotics if you have:  ? A severe infection.  ? A weak immune system.   If caused by narrow nasal passages or nasal polyps, you may need   to have surgery.  Follow these instructions at home:  Medicines   Take, use, or apply over-the-counter and prescription medicines only as told by your health care provider. These may include nasal sprays.   If you were prescribed an antibiotic medicine, take it as told by your health care provider. Do not stop taking the antibiotic even if you start to feel better.  Hydrate and humidify     Drink enough fluid to keep your urine pale yellow. Staying hydrated will help to thin your mucus.   Use a cool mist humidifier to keep the humidity level in your home above 50%.   Inhale steam for 10-15 minutes, 3-4 times a day, or as told by your health care provider. You can do this in the bathroom while a hot shower is  running.   Limit your exposure to cool or dry air.  Rest   Rest as much as possible.   Sleep with your head raised (elevated).   Make sure you get enough sleep each night.  General instructions     Apply a warm, moist washcloth to your face 3-4 times a day or as told by your health care provider. This will help with discomfort.   Wash your hands often with soap and water to reduce your exposure to germs. If soap and water are not available, use hand sanitizer.   Do not smoke. Avoid being around people who are smoking (secondhand smoke).   Keep all follow-up visits as told by your health care provider. This is important.  Contact a health care provider if:   You have a fever.   Your symptoms get worse.   Your symptoms do not improve within 10 days.  Get help right away if:   You have a severe headache.   You have persistent vomiting.   You have severe pain or swelling around your face or eyes.   You have vision problems.   You develop confusion.   Your neck is stiff.   You have trouble breathing.  Summary   Sinusitis is soreness and inflammation of your sinuses. Sinuses are hollow spaces in the bones around your face.   This condition is caused by nasal tissues that become inflamed or swollen. The swelling traps or blocks the flow of mucus. This allows bacteria, viruses, and fungi to grow, which leads to infection.   If you were prescribed an antibiotic medicine, take it as told by your health care provider. Do not stop taking the antibiotic even if you start to feel better.   Keep all follow-up visits as told by your health care provider. This is important.  This information is not intended to replace advice given to you by your health care provider. Make sure you discuss any questions you have with your health care provider.  Document Released: 11/16/2005 Document Revised: 04/18/2018 Document Reviewed: 04/18/2018  Elsevier Interactive Patient Education  2019 [employer]

## 2018-12-06 NOTE — Progress Notes (Signed)
Patient ID: James Weaver, male    DOB: May 07, 1956  Age: 63 y.o. MRN: [id number]    Subjective:  Subjective  HPI James Weaver presents for sinus symptoms x 1 weeks.  + sinus headache, no fever   They  are getting on a plane to [place] tomorrow to help their daughter with surgery she is having   Review of Systems  Constitutional: Positive for chills and fever. Negative for appetite change, diaphoresis, fatigue and unexpected weight change.  HENT: Positive for congestion, postnasal drip, rhinorrhea and sinus pressure.   Eyes: Negative for pain, redness and visual disturbance.  Respiratory: Positive for cough. Negative for chest tightness, shortness of breath and wheezing.   Cardiovascular: Negative for chest pain, palpitations and leg swelling.  Endocrine: Negative for cold intolerance, heat intolerance, polydipsia, polyphagia and polyuria.  Genitourinary: Negative for difficulty urinating, dysuria and frequency.  Allergic/Immunologic: Negative for environmental allergies.  Neurological: Negative for dizziness, light-headedness, numbness and headaches.    History Past Medical History:  Diagnosis Date  . Allergic rhinitis, cause unspecified   . Allergy   . Diabetes mellitus without complication (HCC)    no meds- diet controlled   . Hyperlipemia   . Hypertension     He has a past surgical history that includes arthroscopic  knee surg; Anterior cruciate ligament repair (Jan '11); and Colonoscopy.   His family history includes Cancer in his brother; Diabetes in his mother; Early death in his father; Heart disease in his brother and paternal grandfather; Heart disease (age of onset: 47) in his brother; Hyperlipidemia in his brother, brother, brother, and mother; Hypertension in his brother, brother, brother, and mother.He reports that he has never smoked. He has never used smokeless tobacco. He reports that he does not drink alcohol or use drugs.  Current Outpatient Medications on  File Prior to Visit  Medication Sig Dispense Refill  . ACCU-CHEK FASTCLIX LANCETS MISC Use to check blood sugar daily.  Diagnosis:  E11.9   Non insulin dependent.    Marland Kitchen aspirin 81 MG tablet Take 81 mg by mouth daily.      Marland Kitchen atenolol-chlorthalidone (TENORETIC) 50-25 MG tablet TAKE 1/2 TABLET BY MOUTH EVERY DAY 45 tablet 1  . atorvastatin (LIPITOR) 40 MG tablet TAKE 1 TABLET BY MOUTH EVERY DAY 90 tablet 0  . folic acid (FOLVITE) 1 MG tablet Take 1 mg by mouth daily.      Marland Kitchen glucose blood (ACCU-CHEK GUIDE) test strip Use as instructed to check blood sugar daily.  Diagnosis:  E11.9  Non insulin dependent.    Marland Kitchen loratadine (CLARITIN) 10 MG tablet Take 10 mg by mouth daily.      . vitamin E 400 UNIT capsule Take 400 Units by mouth daily.     No current facility-administered medications on file prior to visit.      Objective:  Objective  Physical Exam Vitals signs and nursing note reviewed.  Constitutional:      Appearance: He is well-developed.  HENT:     Right Ear: External ear normal.     Left Ear: External ear normal.     Nose:     Right Sinus: Maxillary sinus tenderness and frontal sinus tenderness present.     Left Sinus: Maxillary sinus tenderness and frontal sinus tenderness present.  Eyes:     General:        Right eye: No discharge.        Left eye: No discharge.     Conjunctiva/sclera: Conjunctivae normal.  Cardiovascular:     Rate and Rhythm: Normal rate and regular rhythm.     Heart sounds: Normal heart sounds. No murmur.  Pulmonary:     Effort: Pulmonary effort is normal. No respiratory distress.     Breath sounds: Normal breath sounds. No wheezing or rales.  Chest:     Chest wall: No tenderness.  Lymphadenopathy:     Cervical: Cervical adenopathy present.  Neurological:     Mental Status: He is alert and oriented to person, place, and time.    BP 120/80 (BP Location: Right Arm, Cuff Size: Large)   Pulse 78   Temp 99.1 F (37.3 C) (Oral)   Ht 5\' 7"  (1.702 m)   Wt  222 lb 3.2 oz (100.8 kg)   SpO2 97%   BMI 34.80 kg/m  Wt Readings from Last 3 Encounters:  12/06/18 222 lb 3.2 oz (100.8 kg)  12/28/17 221 lb 4 oz (100.4 kg)  12/13/17 221 lb (100.2 kg)     Lab Results  Component Value Date   WBC 6.8 03/25/2011   HGB 15.8 03/25/2011   HCT 44.7 03/25/2011   PLT 270.0 03/25/2011   GLUCOSE 143 (H) 09/17/2017   CHOL 163 09/17/2017   TRIG 142.0 09/17/2017   HDL 38.10 (L) 09/17/2017   LDLDIRECT 110.2 08/04/2012   LDLCALC 96 09/17/2017   ALT 35 09/17/2017   AST 22 09/17/2017   NA 137 09/17/2017   K 3.9 09/17/2017   CL 97 09/17/2017   CREATININE 0.99 09/17/2017   BUN 16 09/17/2017   CO2 31 09/17/2017   TSH 1.11 03/25/2011   PSA 2.95 08/26/2016   HGBA1C 7.0 (H) 12/23/2017   MICROALBUR <0.7 09/17/2017    Ct Cardiac Scoring  Addendum Date: 11/09/2016   ADDENDUM REPORT: 11/09/2016 15:27 CLINICAL DATA:  Risk stratification EXAM: Coronary Calcium Score TECHNIQUE: The patient was scanned on a Siemens Sensation 16 slice scanner. Axial non-contrast 66mm slices were carried out through the heart. The data set was analyzed on a dedicated work station and scored using the Agatson method. FINDINGS: Non-cardiac: No significant non cardiac findings on limited lung and soft tissue windows. See separate report from [facility]. Ascending Aorta:  3.4 cm Pericardium: Normal Coronary arteries: Mild calcium in proximal RCA and proximal and mid LAD IMPRESSION: Coronary calcium score of 41. This was 45th percentile for age and sex matched control. Charlton Haws Electronically Signed   By: Charlton Haws M.D.   On: 11/09/2016 15:27   Result Date: 11/09/2016 EXAM: OVER-READ INTERPRETATION  CT CHEST The following report is an over-read performed by radiologist Dr. Noe Gens [facility] on 11/06/2016. This over-read does not include interpretation of cardiac or coronary anatomy or pathology. The coronary calcium score interpretation by the cardiologist  is attached. COMPARISON:  None. FINDINGS: Cardiovascular: Heart is normal size. Aorta is normal caliber. Mediastinum/Nodes: No adenopathy in the visualized lower mediastinum or hila. Lungs/Pleura: Linear density in the lingula posteriorly, likely scarring. Visualized lungs otherwise clear. No effusions. Upper Abdomen: Imaging into the upper abdomen shows no acute findings. Musculoskeletal: No chest wall mass or suspicious bone lesions identified. IMPRESSION: No acute or significant extracardiac abnormality. Electronically Signed: By: Charlett Nose M.D. On: 11/06/2016 10:05     Assessment & Plan:  Plan  I am having Jacqualine Code start on cefdinir and fluticasone. I am also having him maintain his aspirin, loratadine, folic acid, vitamin E, glucose blood, ACCU-CHEK FASTCLIX LANCETS, atenolol-chlorthalidone, and atorvastatin.  Meds ordered this encounter  Medications  . cefdinir (OMNICEF) 300 MG capsule    Sig: Take 1 capsule (300 mg total) by mouth 2 (two) times daily.    Dispense:  20 capsule    Refill:  0  . fluticasone (FLONASE) 50 MCG/ACT nasal spray    Sig: Place 2 sprays into both nostrils daily.    Dispense:  16 g    Refill:  6    Problem List Items Addressed This Visit    None    Visit Diagnoses    Fever, unspecified fever cause    -  Primary   Relevant Orders   POC Influenza A&B (Binax test) (Completed)   Body aches       Relevant Orders   POC Influenza A&B (Binax test) (Completed)   Pansinusitis, unspecified chronicity       Relevant Medications   cefdinir (OMNICEF) 300 MG capsule   fluticasone (FLONASE) 50 MCG/ACT nasal spray       Ok to con't claritin      Can also use mucinex for cough prn   Follow-up: Return if symptoms worsen or fail to improve.  Donato SchultzYvonne R Lowne Chase, DO

## 2019-01-21 ENCOUNTER — Other Ambulatory Visit: Payer: Self-pay | Admitting: Family Medicine

## 2019-01-23 NOTE — Telephone Encounter (Signed)
Please call patient and schedule CPE.. Send back for refill once appointment is scheduled. 

## 2019-01-24 NOTE — Telephone Encounter (Signed)
Pt said he will check his schedule and call back to set up CPE.

## 2019-01-24 NOTE — Telephone Encounter (Signed)
Pt is scheduled for 02/02/19 @ 3:15pm

## 2019-01-24 NOTE — Telephone Encounter (Signed)
Last office visit 12/28/17. See appointment scheduled

## 2019-01-25 NOTE — Telephone Encounter (Signed)
Sent. Thanks.   

## 2019-01-27 ENCOUNTER — Other Ambulatory Visit (INDEPENDENT_AMBULATORY_CARE_PROVIDER_SITE_OTHER): Payer: BLUE CROSS/BLUE SHIELD

## 2019-01-27 ENCOUNTER — Other Ambulatory Visit: Payer: Self-pay | Admitting: Family Medicine

## 2019-01-27 ENCOUNTER — Other Ambulatory Visit: Payer: Self-pay

## 2019-01-27 DIAGNOSIS — E119 Type 2 diabetes mellitus without complications: Secondary | ICD-10-CM | POA: Diagnosis not present

## 2019-01-27 LAB — LIPID PANEL
CHOLESTEROL: 157 mg/dL (ref 0–200)
HDL: 40.3 mg/dL (ref >39.00)
LDL Cholesterol: 80 mg/dL (ref 0–99)
NonHDL: 117.07
TRIGLYCERIDES: 187 mg/dL — AB (ref 0.0–149.0)
Total CHOL/HDL Ratio: 4
VLDL: 37.4 mg/dL (ref 0.0–40.0)

## 2019-01-27 LAB — COMPREHENSIVE METABOLIC PANEL
ALT: 46 U/L (ref 0–53)
AST: 26 U/L (ref 0–37)
Albumin: 4.2 g/dL (ref 3.5–5.2)
Alkaline Phosphatase: 60 U/L (ref 39–117)
BUN: 20 mg/dL (ref 6–23)
CALCIUM: 9.6 mg/dL (ref 8.4–10.5)
CO2: 32 mEq/L (ref 19–32)
CREATININE: 0.96 mg/dL (ref 0.40–1.50)
Chloride: 98 mEq/L (ref 96–112)
GFR: 79.1 mL/min (ref >60.00)
Glucose, Bld: 153 mg/dL — ABNORMAL HIGH (ref 70–99)
Potassium: 4 mEq/L (ref 3.5–5.1)
SODIUM: 138 meq/L (ref 135–145)
Total Bilirubin: 1.2 mg/dL (ref 0.2–1.2)
Total Protein: 7.3 g/dL (ref 6.0–8.3)

## 2019-01-27 LAB — MICROALBUMIN / CREATININE URINE RATIO
Creatinine,U: 227.1 mg/dL
MICROALB UR: 1 mg/dL (ref 0.0–1.9)
Microalb Creat Ratio: 0.5 mg/g (ref 0.0–30.0)

## 2019-01-27 LAB — HEMOGLOBIN A1C: Hgb A1c MFr Bld: 7.2 % — ABNORMAL HIGH (ref 4.6–6.5)

## 2019-02-02 ENCOUNTER — Ambulatory Visit (INDEPENDENT_AMBULATORY_CARE_PROVIDER_SITE_OTHER): Payer: BLUE CROSS/BLUE SHIELD | Admitting: Family Medicine

## 2019-02-02 ENCOUNTER — Encounter: Payer: Self-pay | Admitting: Family Medicine

## 2019-02-02 VITALS — BP 104/70 | HR 70 | Temp 98.1°F | Ht 67.0 in | Wt 221.2 lb

## 2019-02-02 DIAGNOSIS — E785 Hyperlipidemia, unspecified: Secondary | ICD-10-CM

## 2019-02-02 DIAGNOSIS — Z0000 Encounter for general adult medical examination without abnormal findings: Secondary | ICD-10-CM | POA: Diagnosis not present

## 2019-02-02 DIAGNOSIS — Z7189 Other specified counseling: Secondary | ICD-10-CM

## 2019-02-02 DIAGNOSIS — I10 Essential (primary) hypertension: Secondary | ICD-10-CM | POA: Diagnosis not present

## 2019-02-02 DIAGNOSIS — E119 Type 2 diabetes mellitus without complications: Secondary | ICD-10-CM | POA: Diagnosis not present

## 2019-02-02 DIAGNOSIS — Z0001 Encounter for general adult medical examination with abnormal findings: Secondary | ICD-10-CM

## 2019-02-02 MED ORDER — ATENOLOL-CHLORTHALIDONE 50-25 MG PO TABS: 0.5000 | ORAL_TABLET | Freq: Every day | ORAL | 3 refills | Status: DC

## 2019-02-02 NOTE — Progress Notes (Signed)
CPE- See plan.  Routine anticipatory guidance given to patient.  See health maintenance.  The possibility exists that previously documented standard health maintenance information may have been brought forward from a previous encounter into this note.  If needed, that same information has been updated to reflect the current situation based on today's encounter.    Tetanus 2013  Flu done at work.  ~07/31/18 PNA 2018 Shingles d/w pt.  See AVS.  Colonoscopy 2019 Prostate cancer screening and PSA options (with potential risks and benefits of testing vs not testing) were discussed along with recent recs/guidelines.  He declined testing PSA at this point. Living will d/w pt. Wife designated if patient were incapacitated.  Diet and exercise d/w pt, esp about cutting out carbs.   Elevated Cholesterol: Using medications without problems: yes Muscle aches: no Diet compliance: encouraged.  Exercise: encouraged  Hypertension:    Using medication without problems or lightheadedness: yes Chest pain with exertion:no Edema:no Short of breath:no  Diabetes:  No meds  Hypoglycemic episodes:rare, cautions d/w pt.   Hyperglycemic episodes:no Feet problems:no Blood Sugars averaging: not checked often, ~130-140s usually in the AM eye exam within last year: yes A1c d/w pt.   PMH and SH reviewed  Meds, vitals, and allergies reviewed.   ROS: Per HPI.  Unless specifically indicated otherwise in HPI, the patient denies:  General: fever. Eyes: acute vision changes ENT: sore throat Cardiovascular: chest pain Respiratory: SOB GI: vomiting GU: dysuria Musculoskeletal: acute back pain Derm: acute rash Neuro: acute motor dysfunction Psych: worsening mood Endocrine: polydipsia Heme: bleeding Allergy: hayfever  GEN: nad, alert and oriented HEENT: mucous membranes moist NECK: supple w/o LA CV: rrr. PULM: ctab, no inc wob ABD: soft, +bs EXT: no edema SKIN: no acute rash  Diabetic foot  exam: Normal inspection No skin breakdown No calluses  Normal DP pulses Normal sensation to light touch and monofilament Nails normal

## 2019-02-02 NOTE — Patient Instructions (Addendum)
Recheck in 3 months with A1c at a visit.   The only lab you need to have done for your next diabetic visit is an A1c.  We can do this with a fingerstick test at the office visit.  You do not need a lab visit ahead of time for this.  It does not matter if you are fasting when the lab is done.   Check with your insurance to see if they will cover the shingrix shot. Work on diet and exercise in the meantime.  Take care.  Glad to see you.

## 2019-02-05 NOTE — Assessment & Plan Note (Signed)
Continue current medications.  No change in meds at this point.  Needs work on diet and exercise.  Labs discussed with patient. 

## 2019-02-05 NOTE — Assessment & Plan Note (Signed)
Tetanus 2013  Flu done at work.  ~07/31/18 PNA 2018 Shingles d/w pt.  See AVS.  Colonoscopy 2019 Prostate cancer screening and PSA options (with potential risks and benefits of testing vs not testing) were discussed along with recent recs/guidelines.  He declined testing PSA at this point. Living will d/w pt. Wife designated if patient were incapacitated.  Diet and exercise d/w pt, esp about cutting out carbs.

## 2019-02-05 NOTE — Assessment & Plan Note (Signed)
Continue current medications.  No change in meds at this point.  Needs work on diet and exercise.  Labs discussed with patient.

## 2019-02-05 NOTE — Assessment & Plan Note (Signed)
Labs discussed with patient Recheck in 3 months with A1c at a visit.   Needs diet and exercise in the meantime.  Discussed.

## 2019-02-05 NOTE — Assessment & Plan Note (Signed)
Living will d/w pt.  Wife designated if patient were incapacitated.   ?

## 2019-04-23 ENCOUNTER — Other Ambulatory Visit: Payer: Self-pay | Admitting: Family Medicine

## 2019-05-04 ENCOUNTER — Other Ambulatory Visit (INDEPENDENT_AMBULATORY_CARE_PROVIDER_SITE_OTHER): Payer: BC Managed Care – PPO

## 2019-05-04 ENCOUNTER — Other Ambulatory Visit: Payer: Self-pay | Admitting: Family Medicine

## 2019-05-04 DIAGNOSIS — E119 Type 2 diabetes mellitus without complications: Secondary | ICD-10-CM | POA: Diagnosis not present

## 2019-05-04 LAB — POCT GLYCOSYLATED HEMOGLOBIN (HGB A1C): Hemoglobin A1C: 6.9 % — AB (ref 4.0–5.6)

## 2019-05-05 ENCOUNTER — Ambulatory Visit (INDEPENDENT_AMBULATORY_CARE_PROVIDER_SITE_OTHER): Payer: BC Managed Care – PPO | Admitting: Family Medicine

## 2019-05-05 ENCOUNTER — Encounter: Payer: Self-pay | Admitting: Family Medicine

## 2019-05-05 DIAGNOSIS — E119 Type 2 diabetes mellitus without complications: Secondary | ICD-10-CM

## 2019-05-05 NOTE — Assessment & Plan Note (Signed)
DM controlled. Improved.  No change in meds, no meds currently.  Needs recheck in 3 months with A1c.   His diet improved during the pandemic since he wasn't eating out at restaurants.

## 2019-05-05 NOTE — Progress Notes (Signed)
Virtual visit completed through WebEx or similar program Patient location: home  Provider location: Adult nurse at [facility], office   Pandemic considerations d/w pt.   Limitations and rationale for visit method d/w patient.  Patient agreed to proceed.   CC: fu   HPI:  Diabetes:  No meds.  Hypoglycemic episodes: no Hyperglycemic episodes: no Feet problems: no Blood Sugars averaging: not checked often.   eye exam within last year: yes A1c slightly better at 6.9.  D/w pt.  His diet improved during the pandemic since he wasn't eating out at restaurants.    Meds and allergies reviewed.   ROS: Per HPI unless specifically indicated in ROS section   NAD Speech wnl  A/P: DM controlled. Improved.  No change in meds, no med currently.  Needs recheck in 3 months with A1c.   His diet improved during the pandemic since he wasn't eating out at restaurants.

## 2019-08-08 ENCOUNTER — Encounter: Payer: Self-pay | Admitting: Family Medicine

## 2019-08-08 ENCOUNTER — Ambulatory Visit (INDEPENDENT_AMBULATORY_CARE_PROVIDER_SITE_OTHER): Payer: BC Managed Care – PPO | Admitting: Family Medicine

## 2019-08-08 ENCOUNTER — Other Ambulatory Visit: Payer: Self-pay

## 2019-08-08 VITALS — BP 142/88 | HR 62 | Temp 98.5°F | Ht 67.0 in | Wt 221.3 lb

## 2019-08-08 DIAGNOSIS — Z23 Encounter for immunization: Secondary | ICD-10-CM

## 2019-08-08 DIAGNOSIS — E119 Type 2 diabetes mellitus without complications: Secondary | ICD-10-CM | POA: Diagnosis not present

## 2019-08-08 LAB — POCT GLYCOSYLATED HEMOGLOBIN (HGB A1C): Hemoglobin A1C: 7.1 % — AB (ref 4.0–5.6)

## 2019-08-08 NOTE — Patient Instructions (Addendum)
Keep working on diet and exercise.   Recheck A1c at a visit in about 3-4 months.  Take care.  Glad to see you.  Flu shot today.

## 2019-08-08 NOTE — Progress Notes (Signed)
Diabetes:  No meds.  Hypoglycemic episodes:not unless prolonged fasting.  Cautions d/w pt.   Hyperglycemic episodes:no Feet problems: no Blood Sugars averaging: ~140 on occ checks.  eye exam within last year: yes A1c d/w pt at [place].  Up from prev 6.9--->7.1.   He has been walking more, recently.   Diet and exercise d/w pt.    Meds, vitals, and allergies reviewed.  ROS: Per HPI unless specifically indicated in ROS section   GEN: nad, alert and oriented HEENT: ncat NECK: supple w/o LA CV: rrr. PULM: ctab, no inc wob ABD: soft, +bs EXT: no edema SKIN: no acute rash  Diabetic foot exam: Normal inspection No skin breakdown No calluses  Normal DP pulses Normal sensation to light touch and monofilament Nails normal

## 2019-08-09 NOTE — Assessment & Plan Note (Signed)
A1c d/w pt at [facility].  Up from prev 6.9--->7.1.   He has been walking more, recently.   Diet and exercise d/w pt.   Recheck in about 3-4 months.  He agrees.  No change in meds at this point.

## 2019-08-29 ENCOUNTER — Other Ambulatory Visit: Payer: Self-pay | Admitting: Family Medicine

## 2019-08-29 DIAGNOSIS — J324 Chronic pansinusitis: Secondary | ICD-10-CM

## 2019-11-13 ENCOUNTER — Encounter: Payer: Self-pay | Admitting: Family Medicine

## 2019-11-13 ENCOUNTER — Ambulatory Visit: Payer: BC Managed Care – PPO | Admitting: Family Medicine

## 2019-11-13 ENCOUNTER — Other Ambulatory Visit: Payer: Self-pay

## 2019-11-13 VITALS — BP 130/84 | HR 77 | Temp 97.0°F | Ht 67.0 in | Wt 224.2 lb

## 2019-11-13 DIAGNOSIS — E119 Type 2 diabetes mellitus without complications: Secondary | ICD-10-CM | POA: Diagnosis not present

## 2019-11-13 LAB — POCT GLYCOSYLATED HEMOGLOBIN (HGB A1C): Hemoglobin A1C: 7.7 % — AB (ref 4.0–5.6)

## 2019-11-13 NOTE — Progress Notes (Signed)
This visit occurred during the SARS-CoV-2 public health emergency.  Safety protocols were in place, including screening questions prior to the visit, additional usage of staff PPE, and extensive cleaning of exam room while observing appropriate contact time as indicated for disinfecting solutions.  Diabetes:  No meds.  Hypoglycemic episodes: no sx unless prolonged fasting, down to 104 at lowest. Hyperglycemic episodes: no sx Feet problems:no Blood Sugars averaging: not checked recently.  eye exam within last year: f/u pending, later this month.   A1c done at [facility], d/w pt.   He is working on diet, doing better since eating at home more since pandemic.  Exercise d/w pt, encouraged.  He has a treadmill to use.    Meds, vitals, and allergies reviewed.  ROS: Per HPI unless specifically indicated in ROS section   GEN: nad, alert and oriented HEENT: ncat NECK: supple w/o LA CV: rrr. PULM: ctab, no inc wob ABD: soft, +bs EXT: no edema SKIN: well perfused.

## 2019-11-13 NOTE — Patient Instructions (Signed)
Work on diet and exercise as best you can and recheck with labs prior to a physical in ~01/2020.   Update me as needed.  Take care.  Glad to see you.

## 2019-11-15 NOTE — Assessment & Plan Note (Signed)
A1c done at [facility], d/w pt.   He is working on diet, doing better since eating at home more since pandemic.  Exercise d/w pt, encouraged.  He has a treadmill to use.  No change in meds at this point. Plan to recheck with labs prior to a physical in ~01/2020.

## 2019-11-20 ENCOUNTER — Telehealth: Payer: Self-pay | Admitting: Family Medicine

## 2019-11-20 ENCOUNTER — Other Ambulatory Visit: Payer: Self-pay | Admitting: *Deleted

## 2019-11-20 MED ORDER — ACCU-CHEK GUIDE VI STRP: ORAL_STRIP | 3 refills | Status: DC

## 2019-11-20 NOTE — Telephone Encounter (Signed)
Patient called and stated that he has not checked his blood sugar in a while. He has an accu- check guide but would like the test strips. He stated the pharmacy told him he would need a prescription for the test strips. Would we be able to get a prescription for him?

## 2019-11-20 NOTE — Telephone Encounter (Signed)
Rx sent, Patient advised.  °

## 2019-11-22 LAB — HM DIABETES EYE EXAM

## 2019-12-21 ENCOUNTER — Encounter: Payer: Self-pay | Admitting: Optometry

## 2020-01-22 ENCOUNTER — Other Ambulatory Visit: Payer: Self-pay | Admitting: Family Medicine

## 2020-01-24 ENCOUNTER — Other Ambulatory Visit: Payer: Self-pay | Admitting: Family Medicine

## 2020-02-02 ENCOUNTER — Other Ambulatory Visit: Payer: BC Managed Care – PPO

## 2020-02-06 ENCOUNTER — Encounter: Payer: BC Managed Care – PPO | Admitting: Family Medicine

## 2020-02-29 ENCOUNTER — Ambulatory Visit: Payer: BC Managed Care – PPO | Attending: Internal Medicine

## 2020-02-29 DIAGNOSIS — Z23 Encounter for immunization: Secondary | ICD-10-CM

## 2020-02-29 NOTE — Progress Notes (Signed)
   Covid-19 Vaccination Clinic  Name:  James Weaver    MRN: [id number] DOB: 1956-07-13  02/29/2020  James Weaver was observed post Covid-19 immunization for 15 minutes without incident. He was provided with Vaccine Information Sheet and instruction to access the V-Safe system.   James Weaver was instructed to call 911 with any severe reactions post vaccine: Marland Kitchen Difficulty breathing  . Swelling of face and throat  . A fast heartbeat  . A bad rash all over body  . Dizziness and weakness   Immunizations Administered    Name Date Dose VIS Date Route   Pfizer COVID-19 Vaccine 02/29/2020 10:20 AM 0.3 mL 11/10/2019 Intramuscular   Manufacturer: [employer], [employer]   Lot: [id number]   NDC: [address]-8

## 2020-03-06 ENCOUNTER — Other Ambulatory Visit: Payer: Self-pay | Admitting: Family Medicine

## 2020-03-06 DIAGNOSIS — E119 Type 2 diabetes mellitus without complications: Secondary | ICD-10-CM

## 2020-03-07 ENCOUNTER — Other Ambulatory Visit: Payer: Self-pay

## 2020-03-07 ENCOUNTER — Other Ambulatory Visit (INDEPENDENT_AMBULATORY_CARE_PROVIDER_SITE_OTHER): Payer: BC Managed Care – PPO

## 2020-03-07 DIAGNOSIS — E119 Type 2 diabetes mellitus without complications: Secondary | ICD-10-CM

## 2020-03-07 LAB — COMPREHENSIVE METABOLIC PANEL
ALT: 37 U/L (ref 0–53)
AST: 28 U/L (ref 0–37)
Albumin: 4.3 g/dL (ref 3.5–5.2)
Alkaline Phosphatase: 53 U/L (ref 39–117)
BUN: 17 mg/dL (ref 6–23)
CO2: 29 mEq/L (ref 19–32)
Calcium: 9.4 mg/dL (ref 8.4–10.5)
Chloride: 98 mEq/L (ref 96–112)
Creatinine, Ser: 0.9 mg/dL (ref 0.40–1.50)
GFR: 84.92 mL/min (ref >60.00)
Glucose, Bld: 153 mg/dL — ABNORMAL HIGH (ref 70–99)
Potassium: 4.2 mEq/L (ref 3.5–5.1)
Sodium: 137 mEq/L (ref 135–145)
Total Bilirubin: 1.1 mg/dL (ref 0.2–1.2)
Total Protein: 7.1 g/dL (ref 6.0–8.3)

## 2020-03-07 LAB — LIPID PANEL
Cholesterol: 160 mg/dL (ref 0–200)
HDL: 37 mg/dL — ABNORMAL LOW (ref >39.00)
LDL Cholesterol: 88 mg/dL (ref 0–99)
NonHDL: 122.99
Total CHOL/HDL Ratio: 4
Triglycerides: 173 mg/dL — ABNORMAL HIGH (ref 0.0–149.0)
VLDL: 34.6 mg/dL (ref 0.0–40.0)

## 2020-03-07 LAB — MICROALBUMIN / CREATININE URINE RATIO
Creatinine,U: 105.1 mg/dL
Microalb Creat Ratio: 0.7 mg/g (ref 0.0–30.0)
Microalb, Ur: <0.7 mg/dL (ref 0.0–1.9)

## 2020-03-07 LAB — HEMOGLOBIN A1C: Hgb A1c MFr Bld: 7 % — ABNORMAL HIGH (ref 4.6–6.5)

## 2020-03-07 NOTE — Addendum Note (Signed)
Addended by: Aquilla Solian on: 03/07/2020 10:24 AM   Modules accepted: Orders

## 2020-03-14 ENCOUNTER — Other Ambulatory Visit: Payer: Self-pay

## 2020-03-14 ENCOUNTER — Ambulatory Visit (INDEPENDENT_AMBULATORY_CARE_PROVIDER_SITE_OTHER): Payer: BC Managed Care – PPO | Admitting: Family Medicine

## 2020-03-14 ENCOUNTER — Encounter: Payer: Self-pay | Admitting: Family Medicine

## 2020-03-14 VITALS — BP 130/84 | HR 70 | Temp 96.8°F | Ht 67.0 in | Wt 217.1 lb

## 2020-03-14 DIAGNOSIS — Z7189 Other specified counseling: Secondary | ICD-10-CM

## 2020-03-14 DIAGNOSIS — L509 Urticaria, unspecified: Secondary | ICD-10-CM

## 2020-03-14 DIAGNOSIS — E119 Type 2 diabetes mellitus without complications: Secondary | ICD-10-CM

## 2020-03-14 DIAGNOSIS — N529 Male erectile dysfunction, unspecified: Secondary | ICD-10-CM

## 2020-03-14 DIAGNOSIS — E785 Hyperlipidemia, unspecified: Secondary | ICD-10-CM

## 2020-03-14 DIAGNOSIS — Z0001 Encounter for general adult medical examination with abnormal findings: Secondary | ICD-10-CM | POA: Diagnosis not present

## 2020-03-14 DIAGNOSIS — I10 Essential (primary) hypertension: Secondary | ICD-10-CM

## 2020-03-14 MED ORDER — ATENOLOL-CHLORTHALIDONE 50-25 MG PO TABS: 0.5000 | ORAL_TABLET | Freq: Every day | ORAL | 3 refills | Status: DC

## 2020-03-14 MED ORDER — SILDENAFIL CITRATE 20 MG PO TABS: 20.0000 mg | ORAL_TABLET | Freq: Every day | ORAL | 2 refills | Status: DC | PRN

## 2020-03-14 NOTE — Patient Instructions (Signed)
Recheck in about 6 months.  Keep working on diet and exercise.  A1c at the visit, nonfasting.   If you have significant sugar changes in the meantime then let me know.  Take care.  Glad to see you.

## 2020-03-14 NOTE — Progress Notes (Signed)
This visit occurred during the SARS-CoV-2 public health emergency.  Safety protocols were in place, including screening questions prior to the visit, additional usage of staff PPE, and extensive cleaning of exam room while observing appropriate contact time as indicated for disinfecting solutions.  CPE- See plan.  Routine anticipatory guidance given to patient.  See health maintenance.  The possibility exists that previously documented standard health maintenance information may have been brought forward from a previous encounter into this note.  If needed, that same information has been updated to reflect the current situation based on today's encounter.    Tetanus 2013  Flu done at work.  2020 PNA 2018 Shingles d/w pt. covid vaccine d/w pt.   Colonoscopy 2019 Prostate cancer screening and PSA options(with potential risks and benefits of testing vs not testing) were discussed along with recent recs/guidelines. He declined testing PSAat this point. Living will d/w pt. Wife designated if patient were incapacitated.   Diabetes:  No meds.   Hypoglycemic episodes: no Hyperglycemic episodes:no Feet problems: no Blood Sugars averaging: 140-150s now eye exam within last year: yes Labs d/w pt.   Elevated Cholesterol: Using medications without problems:yes Muscle aches: no Diet compliance: yes Exercise: yes Still on atorvastatin.    Hypertension:    Using medication without problems or lightheadedness: yes Chest pain with exertion:no Edema:no Short of breath:no Labs d/w pt.   Still on atenolol/chlorthalidone at baseline.    ED dw pt.  Gradually worse.  D/w pt about options.  No NTG use.    He has had some episodic hives without airway symptoms.  He occasionally uses antihistamines with relief.  No clear trigger such as eating meat that would be suggestive of alpha gal.  No symptoms currently.  PMH and SH reviewed  Meds, vitals, and allergies reviewed.   ROS: Per HPI.  Unless  specifically indicated otherwise in HPI, the patient denies:  General: fever. Eyes: acute vision changes ENT: sore throat Cardiovascular: chest pain Respiratory: SOB GI: vomiting GU: dysuria Musculoskeletal: acute back pain Derm: acute rash Neuro: acute motor dysfunction Psych: worsening mood Endocrine: polydipsia Heme: bleeding Allergy: hayfever  GEN: nad, alert and oriented HEENT: ncat NECK: supple w/o LA, no stridor. CV: rrr. PULM: ctab, no inc wob ABD: soft, +bs EXT: no edema SKIN: no acute rash.

## 2020-03-18 DIAGNOSIS — N529 Male erectile dysfunction, unspecified: Secondary | ICD-10-CM | POA: Insufficient documentation

## 2020-03-18 DIAGNOSIS — L509 Urticaria, unspecified: Secondary | ICD-10-CM | POA: Insufficient documentation

## 2020-03-18 NOTE — Assessment & Plan Note (Signed)
Continue atenolol/chlorthalidone.  Continue work on diet and exercise.  He agrees.

## 2020-03-18 NOTE — Assessment & Plan Note (Signed)
Living will d/w pt.  Wife designated if patient were incapacitated.   ?

## 2020-03-18 NOTE — Assessment & Plan Note (Signed)
No change in meds at this point.  Continue work on diet and exercise and repeat A1c in a few months.  See notes on labs.

## 2020-03-18 NOTE — Assessment & Plan Note (Signed)
Continue work on diet and exercise.  Continue Lipitor.  He will update me as needed.

## 2020-03-18 NOTE — Assessment & Plan Note (Signed)
ED dw pt.  Gradually worse.  D/w pt about options.  No NTG use.   He can try sildenafil with routine cautions.

## 2020-03-18 NOTE — Assessment & Plan Note (Signed)
Tetanus 2013  Flu done at work.  2020 PNA 2018 Shingles d/w pt. covid vaccine d/w pt.   Colonoscopy 2019 Prostate cancer screening and PSA options(with potential risks and benefits of testing vs not testing) were discussed along with recent recs/guidelines. He declined testing PSAat this point. Living will d/w pt. Wife designated if patient were incapacitated.

## 2020-03-18 NOTE — Assessment & Plan Note (Signed)
He has had some episodic hives without airway symptoms.  He occasionally uses antihistamines with relief.  No clear trigger such as eating meat that would be suggestive of alpha gal.  No symptoms currently.  He will monitor for triggers and symptoms in the meantime.  Continue as needed antihistamines with routine cautions and he will update me as needed.

## 2020-03-26 ENCOUNTER — Ambulatory Visit: Payer: BC Managed Care – PPO | Attending: Internal Medicine

## 2020-03-26 DIAGNOSIS — Z23 Encounter for immunization: Secondary | ICD-10-CM

## 2020-03-26 NOTE — Progress Notes (Signed)
   Covid-19 Vaccination Clinic  Name:  James Weaver    MRN: [id number] DOB: 08/20/1956  03/26/2020  Mr. James Weaver was observed post Covid-19 immunization for 15 minutes without incident. He was provided with Vaccine Information Sheet and instruction to access the V-Safe system.   Mr. James Weaver was instructed to call 911 with any severe reactions post vaccine: Marland Kitchen Difficulty breathing  . Swelling of face and throat  . A fast heartbeat  . A bad rash all over body  . Dizziness and weakness   Immunizations Administered    Name Date Dose VIS Date Route   Pfizer COVID-19 Vaccine 03/26/2020  9:45 AM 0.3 mL 01/24/2019 Intramuscular   Manufacturer: [employer], [employer]   Lot: [id number]   NDC: [address]-0

## 2020-05-13 ENCOUNTER — Telehealth (INDEPENDENT_AMBULATORY_CARE_PROVIDER_SITE_OTHER): Payer: BC Managed Care – PPO | Admitting: Family Medicine

## 2020-05-13 ENCOUNTER — Encounter: Payer: Self-pay | Admitting: Family Medicine

## 2020-05-13 DIAGNOSIS — J069 Acute upper respiratory infection, unspecified: Secondary | ICD-10-CM | POA: Diagnosis not present

## 2020-05-13 NOTE — Assessment & Plan Note (Signed)
With pnd and mild cough  No facial pain or purulent d/c  No fever or sob   I suspect this is a viral cold and needs to run its course Will continue chlorcedin and claritin and flonase Can add nasal saline if needed Disc s/s of bacterial sinusitis to watch for incl sinus pain and darker color mucous Also worse cough or wheezing  Rest/fluids recommended  Adv to get covid test if not improving  Update if not starting to improve in a week or if worsening

## 2020-05-13 NOTE — Progress Notes (Signed)
Virtual Visit via Video Note  I connected with James Weaver on 05/13/20 at 12:00 PM EDT by a video enabled telemedicine application and verified that I am speaking with the correct person using two identifiers.  Location: Patient: home Provider: office   I discussed the limitations of evaluation and management by telemedicine and the availability of in person appointments. The patient expressed understanding and agreed to proceed.  Parties involved in encounter  Patient: James Weaver  Provider:  Loura Pardon MD   History of Present Illness: 64 yo pt of Dr Damita Dunnings presents with complaint of cough and congestion  He has a h/o diabetes and HTN   He has been immunized for covid 19  Symptoms started middle of last week Tired -Wednesday and then post nasal drainage (much worse at night) Now more sinus pressure  Clears his throat all the time  Throat was sore- improved today (drinking lots of fluids)  Hoarse voice   Some cough-not a lot - dry cough  otc cough med helps   No dizziness No ear pain or fullness   No fever  No chills or aches  No loss of taste/smell above baseline   Does not spit out drainage    Otc: chlorcedin cough/congestion  Takes daily claritin  Also flonase on and off when needed   Has a sick contact - wife has cold symptoms as well  She tested negative for covid Similar symptoms  No child contacts   Patient Active Problem List   Diagnosis Date Noted  . Erectile dysfunction 03/18/2020  . Hives 03/18/2020  . Viral URI with cough 11/02/2016  . FH: CAD (coronary artery disease) 09/02/2016  . Partial tear of left Achilles tendon 07/18/2015  . Advance care planning 05/31/2015  . Diabetes mellitus without complication ([place]) 81/19/1478  . Encounter for general adult medical examination with abnormal findings 08/07/2012  . ACL TEAR, RIGHT KNEE 03/31/2010  . TIBIALIS TENDINITIS 10/09/2009  . ABNORMALITY OF GAIT 10/09/2009  . HLD (hyperlipidemia)  08/22/2007  . Essential hypertension 08/22/2007   Past Medical History:  Diagnosis Date  . Allergic rhinitis, cause unspecified   . Allergy   . Diabetes mellitus without complication ([place])    no meds- diet controlled   . Hyperlipemia   . Hypertension    Past Surgical History:  Procedure Laterality Date  . ANTERIOR CRUCIATE LIGAMENT REPAIR  Jan '11   left knee  . arthroscopic  knee surg     left 2000  . COLONOSCOPY     Social History   Tobacco Use  . Smoking status: Never Smoker  . Smokeless tobacco: Never Used  Substance Use Topics  . Alcohol use: No    Alcohol/week: 0.0 standard drinks  . Drug use: No   Family History  Problem Relation Age of Onset  . Hypertension Mother   . Hyperlipidemia Mother   . Diabetes Mother   . Early death Father   . Heart disease Brother        CABG  . Hyperlipidemia Brother   . Hypertension Brother   . Heart disease Brother 59       CABG '11  . Hyperlipidemia Brother   . Hypertension Brother   . Hyperlipidemia Brother   . Hypertension Brother   . Cancer Brother        glioblastoma multiforme  . Heart disease Paternal Grandfather   . Stroke Neg Hx   . Prostate cancer Neg Hx   . Colon cancer Neg Hx   .  Colon polyps Neg Hx   . Rectal cancer Neg Hx   . Stomach cancer Neg Hx    No Known Allergies Current Outpatient Medications on File Prior to Visit  Medication Sig Dispense Refill  . ACCU-CHEK FASTCLIX LANCETS MISC Use to check blood sugar daily.  Diagnosis:  E11.9   Non insulin dependent.    Marland Kitchen aspirin 81 MG tablet Take 81 mg by mouth daily.      Marland Kitchen atenolol-chlorthalidone (TENORETIC) 50-25 MG tablet Take 0.5 tablets by mouth daily. 45 tablet 3  . atorvastatin (LIPITOR) 40 MG tablet TAKE 1 TABLET BY MOUTH EVERY DAY 90 tablet 3  . fluticasone (FLONASE) 50 MCG/ACT nasal spray Place 2 sprays into both nostrils daily. 16 g 6  . folic acid (FOLVITE) 1 MG tablet Take 1 mg by mouth daily.      Marland Kitchen glucose blood (ACCU-CHEK GUIDE) test  strip Use as instructed to check blood sugar daily.  Diagnosis:  E11.9  Non insulin dependent. 100 each 3  . loratadine (CLARITIN) 10 MG tablet Take 10 mg by mouth daily.      . sildenafil (REVATIO) 20 MG tablet Take 1-5 tablets (20-100 mg total) by mouth daily as needed. 50 tablet 2  . vitamin E 400 UNIT capsule Take 400 Units by mouth daily.     No current facility-administered medications on file prior to visit.   Review of Systems  Constitutional: Positive for malaise/fatigue. Negative for chills and fever.  HENT: Positive for sore throat. Negative for congestion, ear pain and sinus pain.        Post nasal drip  Eyes: Negative for blurred vision, discharge and redness.  Respiratory: Positive for cough. Negative for sputum production, shortness of breath, wheezing and stridor.   Cardiovascular: Negative for chest pain, palpitations and leg swelling.  Gastrointestinal: Negative for abdominal pain, diarrhea, nausea and vomiting.  Musculoskeletal: Negative for myalgias.  Skin: Negative for rash.  Neurological: Negative for dizziness and headaches.    Observations/Objective: Patient appears well, in no distress Weight is baseline  No facial swelling or asymmetry Mildly hoarse voice  No obvious tremor or mobility impairment Moving neck and UEs normally Able to hear the call well  No cough or shortness of breath during interview (he does clear throat occasionally) Talkative and mentally sharp with no cognitive changes No skin changes on face or neck , no rash or pallor Affect is normal    Assessment and Plan: Problem List Items Addressed This Visit      Respiratory   Viral URI with cough    With pnd and mild cough  No facial pain or purulent d/c  No fever or sob   I suspect this is a viral cold and needs to run its course Will continue chlorcedin and claritin and flonase Can add nasal saline if needed Disc s/s of bacterial sinusitis to watch for incl sinus pain and darker  color mucous Also worse cough or wheezing  Rest/fluids recommended  Adv to get covid test if not improving  Update if not starting to improve in a week or if worsening            Follow Up Instructions: Drink fluids and rest (also rest your voice)  Continue current over the counter medicines  Add nasal saline if helpful   Update if not starting to improve in a week or if worsening  (especially if sinus pain/pressure and yellow/green mucous) If cough worsens or you wheeze, also call  Alert [place] if fever or change in taste/smell as well   Get tested for covid if you do not improve (this is less likely)    I discussed the assessment and treatment plan with the patient. The patient was provided an opportunity to ask questions and all were answered. The patient agreed with the plan and demonstrated an understanding of the instructions.   The patient was advised to call back or seek an in-person evaluation if the symptoms worsen or if the condition fails to improve as anticipated.     Loura Pardon, MD

## 2020-05-13 NOTE — Patient Instructions (Signed)
Drink fluids and rest (also rest your voice)  Continue current over the counter medicines  Add nasal saline if helpful   Update if not starting to improve in a week or if worsening  (especially if sinus pain/pressure and yellow/green mucous) If cough worsens or you wheeze, also call   Alert [place] if fever or change in taste/smell as well   Get tested for covid if you do not improve (this is less likely)

## 2020-06-08 DIAGNOSIS — Z20822 Contact with and (suspected) exposure to covid-19: Secondary | ICD-10-CM | POA: Diagnosis not present

## 2020-09-08 ENCOUNTER — Other Ambulatory Visit: Payer: Self-pay | Admitting: Family Medicine

## 2020-09-13 ENCOUNTER — Ambulatory Visit: Payer: BC Managed Care – PPO | Admitting: Family Medicine

## 2020-09-13 ENCOUNTER — Encounter: Payer: Self-pay | Admitting: Family Medicine

## 2020-09-13 ENCOUNTER — Other Ambulatory Visit: Payer: Self-pay

## 2020-09-13 ENCOUNTER — Other Ambulatory Visit: Payer: Self-pay | Admitting: *Deleted

## 2020-09-13 VITALS — BP 140/82 | HR 71 | Temp 96.4°F | Ht 67.0 in | Wt 218.5 lb

## 2020-09-13 DIAGNOSIS — Z23 Encounter for immunization: Secondary | ICD-10-CM

## 2020-09-13 DIAGNOSIS — E119 Type 2 diabetes mellitus without complications: Secondary | ICD-10-CM | POA: Diagnosis not present

## 2020-09-13 LAB — POCT GLYCOSYLATED HEMOGLOBIN (HGB A1C): Hemoglobin A1C: 8 % — AB (ref 4.0–5.6)

## 2020-09-13 MED ORDER — METFORMIN HCL 500 MG PO TABS: 500.0000 mg | ORAL_TABLET | Freq: Two times a day (BID) | ORAL | 3 refills | Status: DC

## 2020-09-13 NOTE — Progress Notes (Signed)
This visit occurred during the SARS-CoV-2 public health emergency.  Safety protocols were in place, including screening questions prior to the visit, additional usage of staff PPE, and extensive cleaning of exam room while observing appropriate contact time as indicated for disinfecting solutions.  Diabetes:  No meds Hypoglycemic episodes: no Hyperglycemic episodes: no Feet problems: no Blood Sugars averaging: now 160s in the AMs.  eye exam within last year: yes A1c up to 8 from 7.  D/w pt about med options with routine cautions with metformin.  Diet and exercise d/w pt.    Flu shot today at OV.    Meds, vitals, and allergies reviewed.  ROS: Per HPI unless specifically indicated in ROS section   GEN: nad, alert and oriented HEENT: ncat NECK: supple w/o LA CV: rrr. PULM: ctab, no inc wob ABD: soft, +bs EXT: no edema SKIN: well perfused.    Diabetic foot exam: Normal inspection No skin breakdown No calluses  Normal DP pulses Normal sensation to light touch and monofilament Nails normal

## 2020-09-13 NOTE — Patient Instructions (Addendum)
Add on metformin once a day.  If you tolerate it, then increase to 1 pill twice a day.   Plan on recheck in about 3 months.  Get on the treadmill and keep working on your diet.   Take care.  Glad to see you.

## 2020-09-16 NOTE — Assessment & Plan Note (Signed)
Discussed diet and exercise Add on metformin once a day.  If tolerated, then increase to 1 pill twice a day.   Plan on recheck in about 3 months.  He agrees with plan.  Rationale discussed with patient.  Routine cautions given to patient.

## 2020-10-02 ENCOUNTER — Other Ambulatory Visit: Payer: Self-pay | Admitting: Family Medicine

## 2020-10-03 NOTE — Telephone Encounter (Signed)
Pharmacy requests refill on: Accu-Chek Guide Test Strip  LAST REFILL: 03/14/18 LAST OV: 09/13/2020 NEXT OV: 12/09/2020 PHARMACY: [id number] [address]  Pharmacy requests refill on: Atenolol-Chlorthalidone 50-25  LAST REFILL: 03/14/2020 LAST OV: 09/13/2020 NEXT OV: 12/09/2020 PHARMACY: [facility] [address]

## 2020-10-18 DIAGNOSIS — Z03818 Encounter for observation for suspected exposure to other biological agents ruled out: Secondary | ICD-10-CM | POA: Diagnosis not present

## 2020-12-09 ENCOUNTER — Ambulatory Visit: Payer: BC Managed Care – PPO | Admitting: Family Medicine

## 2020-12-12 DIAGNOSIS — E119 Type 2 diabetes mellitus without complications: Secondary | ICD-10-CM | POA: Diagnosis not present

## 2020-12-13 ENCOUNTER — Encounter: Payer: Self-pay | Admitting: Family Medicine

## 2020-12-13 ENCOUNTER — Ambulatory Visit: Payer: BC Managed Care – PPO | Admitting: Family Medicine

## 2020-12-13 ENCOUNTER — Other Ambulatory Visit: Payer: Self-pay

## 2020-12-13 VITALS — BP 118/78 | HR 83 | Temp 97.9°F | Ht 67.0 in | Wt 229.0 lb

## 2020-12-13 DIAGNOSIS — E119 Type 2 diabetes mellitus without complications: Secondary | ICD-10-CM | POA: Diagnosis not present

## 2020-12-13 LAB — POCT GLYCOSYLATED HEMOGLOBIN (HGB A1C): Hemoglobin A1C: 6.9 % — AB (ref 4.0–5.6)

## 2020-12-13 NOTE — Progress Notes (Signed)
This visit occurred during the SARS-CoV-2 public health emergency.  Safety protocols were in place, including screening questions prior to the visit, additional usage of staff PPE, and extensive cleaning of exam room while observing appropriate contact time as indicated for disinfecting solutions.  Diabetes:  Using medications without difficulties: metformin BID.  Hypoglycemic episodes: no sx  Hyperglycemic episodes: no sx Feet problems: no tingling.   Blood Sugars averaging: not checked often.   eye exam within last year: done yesterday, no retinopathy per patient report.   A1c done at [facility].  6.9. He is working on diet and exercise, both he and his wife.   Going to [place], [place] for a few months, leaving tomorrow.    Meds, vitals, and allergies reviewed.  ROS: Per HPI unless specifically indicated in ROS section   GEN: nad, alert and oriented HEENT: ncat NECK: supple w/o LA CV: rrr. PULM: ctab, no inc wob ABD: soft, +bs EXT: no edema SKIN: no acute rash

## 2020-12-13 NOTE — Patient Instructions (Signed)
If you have trouble with low sugars, then cut back on metformin and let me know.  Plan on recheck in about 3-4 months with labs ahead of a physical.  Take care.  Glad to see you. Thanks for your effort.

## 2020-12-16 NOTE — Assessment & Plan Note (Signed)
A1c done at OV.  6.9. Controlled/improved. He is working on diet and exercise, both he and his wife.   Going to [place], [place] for a few months, leaving tomorrow.   Continue work on diet and exercise. Continue metformin. Recheck periodically. He agrees.

## 2020-12-30 ENCOUNTER — Other Ambulatory Visit: Payer: Self-pay | Admitting: Family Medicine

## 2020-12-31 ENCOUNTER — Other Ambulatory Visit: Payer: Self-pay | Admitting: Family Medicine

## 2021-03-09 ENCOUNTER — Other Ambulatory Visit: Payer: Self-pay | Admitting: Family Medicine

## 2021-03-09 DIAGNOSIS — E119 Type 2 diabetes mellitus without complications: Secondary | ICD-10-CM

## 2021-03-21 ENCOUNTER — Other Ambulatory Visit: Payer: Self-pay

## 2021-03-21 ENCOUNTER — Other Ambulatory Visit (INDEPENDENT_AMBULATORY_CARE_PROVIDER_SITE_OTHER): Payer: BC Managed Care – PPO

## 2021-03-21 DIAGNOSIS — E119 Type 2 diabetes mellitus without complications: Secondary | ICD-10-CM

## 2021-03-21 LAB — MICROALBUMIN / CREATININE URINE RATIO
Creatinine,U: 138.7 mg/dL
Microalb Creat Ratio: 0.5 mg/g (ref 0.0–30.0)
Microalb, Ur: 0.7 mg/dL (ref 0.0–1.9)

## 2021-03-21 LAB — LIPID PANEL
Cholesterol: 138 mg/dL (ref 0–200)
HDL: 33 mg/dL — ABNORMAL LOW (ref >39.00)
NonHDL: 105.48
Total CHOL/HDL Ratio: 4
Triglycerides: 216 mg/dL — ABNORMAL HIGH (ref 0.0–149.0)
VLDL: 43.2 mg/dL — ABNORMAL HIGH (ref 0.0–40.0)

## 2021-03-21 LAB — COMPREHENSIVE METABOLIC PANEL
ALT: 36 U/L (ref 0–53)
AST: 24 U/L (ref 0–37)
Albumin: 4.1 g/dL (ref 3.5–5.2)
Alkaline Phosphatase: 49 U/L (ref 39–117)
BUN: 17 mg/dL (ref 6–23)
CO2: 31 mEq/L (ref 19–32)
Calcium: 9.5 mg/dL (ref 8.4–10.5)
Chloride: 97 mEq/L (ref 96–112)
Creatinine, Ser: 0.88 mg/dL (ref 0.40–1.50)
GFR: 90.45 mL/min (ref >60.00)
Glucose, Bld: 148 mg/dL — ABNORMAL HIGH (ref 70–99)
Potassium: 3.7 mEq/L (ref 3.5–5.1)
Sodium: 137 mEq/L (ref 135–145)
Total Bilirubin: 1.1 mg/dL (ref 0.2–1.2)
Total Protein: 7.1 g/dL (ref 6.0–8.3)

## 2021-03-21 LAB — LDL CHOLESTEROL, DIRECT: Direct LDL: 80 mg/dL

## 2021-03-21 LAB — HEMOGLOBIN A1C: Hgb A1c MFr Bld: 7.3 % — ABNORMAL HIGH (ref 4.6–6.5)

## 2021-03-24 ENCOUNTER — Encounter: Payer: Self-pay | Admitting: Family Medicine

## 2021-03-24 ENCOUNTER — Ambulatory Visit (INDEPENDENT_AMBULATORY_CARE_PROVIDER_SITE_OTHER): Payer: BC Managed Care – PPO | Admitting: Family Medicine

## 2021-03-24 ENCOUNTER — Other Ambulatory Visit: Payer: Self-pay

## 2021-03-24 VITALS — BP 120/82 | HR 74 | Temp 97.7°F | Ht 67.0 in | Wt 217.0 lb

## 2021-03-24 DIAGNOSIS — E785 Hyperlipidemia, unspecified: Secondary | ICD-10-CM

## 2021-03-24 DIAGNOSIS — Z0000 Encounter for general adult medical examination without abnormal findings: Secondary | ICD-10-CM | POA: Diagnosis not present

## 2021-03-24 DIAGNOSIS — I10 Essential (primary) hypertension: Secondary | ICD-10-CM

## 2021-03-24 DIAGNOSIS — E119 Type 2 diabetes mellitus without complications: Secondary | ICD-10-CM

## 2021-03-24 DIAGNOSIS — Z8249 Family history of ischemic heart disease and other diseases of the circulatory system: Secondary | ICD-10-CM

## 2021-03-24 DIAGNOSIS — Z0001 Encounter for general adult medical examination with abnormal findings: Secondary | ICD-10-CM

## 2021-03-24 MED ORDER — ATENOLOL-CHLORTHALIDONE 50-25 MG PO TABS: 0.5000 | ORAL_TABLET | Freq: Every day | ORAL | 3 refills | Status: DC

## 2021-03-24 NOTE — Patient Instructions (Signed)
We'll call about seeing cardiology.  Plan on recheck in about 6 months, A1c at the visit.  Take care.  Glad to see you.

## 2021-03-24 NOTE — Progress Notes (Signed)
This visit occurred during the SARS-CoV-2 public health emergency.  Safety protocols were in place, including screening questions prior to the visit, additional usage of staff PPE, and extensive cleaning of exam room while observing appropriate contact time as indicated for disinfecting solutions.  CPE- See plan.  Routine anticipatory guidance given to patient.  See health maintenance.  The possibility exists that previously documented standard health maintenance information may have been brought forward from a previous encounter into this note.  If needed, that same information has been updated to reflect the current situation based on today's encounter.    Tetanus 2013  Flu 2021 PNA 2018 Shingles d/w pt. covid vaccine d/w pt.   Colonoscopy 2019 Prostate cancer screening and PSA options(with potential risks and benefits of testing vs not testing) were discussed along with recent recs/guidelines. He declined testing PSAat this point. Living will d/w pt. Wife designated if patient were incapacitated.   FH CAD, d/w pt about referral back to Dr. Eden Emms.  Referral placed.  Diabetes:  Using medications without difficulties: yes Hypoglycemic episodes:no Hyperglycemic episodes:no Feet problems:no Blood Sugars averaging: 150 this AM.  Checked episodically.   eye exam within last year: done fall 2021- [facility] [place] A1c d/w pt.  Up from prev but had been travelling and he'll work on diet at home.    Hypertension:    Using medication without problems or lightheadedness: yes Chest pain with exertion:no Edema:no Short of breath:no  Elevated Cholesterol: Using medications without problems: yes Muscle aches: no Diet compliance: yes Exercise: yes  ROS: Per HPI.  Unless specifically indicated otherwise in HPI, the patient denies:  General: fever. Eyes: acute vision changes ENT: sore throat Cardiovascular: chest pain Respiratory: SOB GI: vomiting GU: dysuria Musculoskeletal:  acute back pain Derm: acute rash Neuro: acute motor dysfunction Psych: worsening mood Endocrine: polydipsia Heme: bleeding Allergy: hayfever  PMH and SH reviewed.  Vital signs, Meds and allergies reviewed.  GEN: nad, alert and oriented HEENT: ncat NECK: supple w/o LA CV: rrr.  PULM: ctab, no inc wob ABD: soft, +bs EXT: no edema SKIN: no acute rash

## 2021-03-26 DIAGNOSIS — Z0000 Encounter for general adult medical examination without abnormal findings: Secondary | ICD-10-CM | POA: Insufficient documentation

## 2021-03-26 NOTE — Assessment & Plan Note (Signed)
A1c d/w pt.  Up from prev but had been travelling and he'll work on diet at home.   Continue metformin for now.  Recheck periodically.

## 2021-03-26 NOTE — Assessment & Plan Note (Signed)
Labs discussed with patient.  Continue atorvastatin.  Continue work on diet and exercise.

## 2021-03-26 NOTE — Assessment & Plan Note (Signed)
Labs discussed with patient.  Continue atenolol chlorthalidone.  Continue work on diet and exercise.

## 2021-03-26 NOTE — Assessment & Plan Note (Signed)
FH CAD, d/w pt about referral back to Dr. Eden Emms.  Referral placed.  No chest pain.

## 2021-03-26 NOTE — Assessment & Plan Note (Signed)
Tetanus 2013  Flu 2021 PNA 2018 Shingles d/w pt. covid vaccine d/w pt.   Colonoscopy 2019 Prostate cancer screening and PSA options(with potential risks and benefits of testing vs not testing) were discussed along with recent recs/guidelines. He declined testing PSAat this point. Living will d/w pt. Wife designated if patient were incapacitated.  

## 2021-03-26 NOTE — Assessment & Plan Note (Signed)
Tetanus 2013  Flu 2021 PNA 2018 Shingles d/w pt. covid vaccine d/w pt.   Colonoscopy 2019 Prostate cancer screening and PSA options(with potential risks and benefits of testing vs not testing) were discussed along with recent recs/guidelines. He declined testing PSAat this point. Living will d/w pt. Wife designated if patient were incapacitated.

## 2021-04-05 ENCOUNTER — Other Ambulatory Visit: Payer: Self-pay | Admitting: Family Medicine

## 2021-04-07 NOTE — Telephone Encounter (Signed)
Pharmacy requests refill on: Accu-Chek Guide Test Strip   LAST REFILL: 12/30/2020 (Q-100, R-0) LAST OV: 03/24/2021 NEXT OV: Not Scheduled  PHARMACY: [id number] [place], [place]

## 2021-04-27 ENCOUNTER — Other Ambulatory Visit: Payer: Self-pay | Admitting: Family Medicine

## 2021-05-20 ENCOUNTER — Encounter: Payer: Self-pay | Admitting: Cardiology

## 2021-05-20 DIAGNOSIS — E118 Type 2 diabetes mellitus with unspecified complications: Secondary | ICD-10-CM | POA: Insufficient documentation

## 2021-05-20 NOTE — Progress Notes (Signed)
Cardiology Office Note   Date:  05/21/2021   ID:  James Weaver, James Weaver 05-10-1956, MRN [id number]  PCP:  Joaquim Nam, MD  Cardiologist:   None Referring:  Joaquim Nam, MD  Chief Complaint  Patient presents with   Elevated coronary calcium       History of Present Illness: Janathan Bribiesca is a 65 y.o. male who presents for evaluation of elevated coronary calcium.  He was referred by Joaquim Nam, MD  He had a normal stress echo in 2017.  He saw Dr. Eden Emms at that time.  At that time he had a calcium score of 41 which was 45th percentile.  However, he has a very strong family history with 2 brothers having early heart disease in his father dying in his 63s of early heart disease.  In fact his son who is with the DOD in [place] is having an angioplasty today.    The patient has not had any further cardiovascular testing since the above.  He does do some chores that are physically active.  With this he denies any chest discomfort, neck or arm discomfort.  He has new PND or orthopnea.  He has no palpitations, presyncope or syncope.  He has had no weight gain or edema.  He might get short of breath with significant activity.   Past Medical History:  Diagnosis Date   Diabetes mellitus without complication (HCC)    Hyperlipemia    Hypertension     Past Surgical History:  Procedure Laterality Date   ANTERIOR CRUCIATE LIGAMENT REPAIR  Jan '11   left knee   arthroscopic  knee surg     left 2000   COLONOSCOPY       Current Outpatient Medications  Medication Sig Dispense Refill   ACCU-CHEK FASTCLIX LANCETS MISC Use to check blood sugar daily.  Diagnosis:  E11.9   Non insulin dependent.     aspirin 81 MG tablet Take 81 mg by mouth daily.     atenolol-chlorthalidone (TENORETIC) 50-25 MG tablet Take 0.5 tablets by mouth daily. 45 tablet 3   atorvastatin (LIPITOR) 40 MG tablet TAKE 1 TABLET BY MOUTH EVERY DAY 90 tablet 3   fluticasone (FLONASE) 50 MCG/ACT nasal spray  Place 2 sprays into both nostrils daily. 16 g 6   folic acid (FOLVITE) 1 MG tablet Take 1 mg by mouth daily.     glucose blood (ACCU-CHEK GUIDE) test strip USE AS INSTRUCTED TO CHECK BLOOD SUGAR DAILY. 100 strip 0   loratadine (CLARITIN) 10 MG tablet Take 10 mg by mouth daily.     metFORMIN (GLUCOPHAGE) 500 MG tablet Take 1 tablet (500 mg total) by mouth 2 (two) times daily with a meal. 180 tablet 3   sildenafil (REVATIO) 20 MG tablet TAKE 1 TO 5 TABLETS BY MOUTH DAILY AS NEEDED 50 tablet 2   vitamin E 400 UNIT capsule Take 400 Units by mouth daily.     No current facility-administered medications for this visit.    Allergies:   Patient has no known allergies.    Social History:  The patient  reports that he has never smoked. He has never used smokeless tobacco. He reports that he does not drink alcohol and does not use drugs.   Family History:  The patient's family history includes Cancer in his brother; Diabetes in his mother; Early death (age of onset: 77) in his father; Heart disease in his brother and paternal grandfather; Heart disease (age  of onset: 25) in his brother; Hyperlipidemia in his brother, brother, brother, and mother; Hypertension in his brother, brother, brother, and mother.    ROS:  Please see the history of present illness.   Otherwise, review of systems are positive for none.   All other systems are reviewed and negative.    PHYSICAL EXAM: VS:  BP 123/78   Pulse 73   Ht 5\' 7"  (1.702 m)   Wt 214 lb 12.8 oz (97.4 kg)   SpO2 97%   BMI 33.64 kg/m  , BMI Body mass index is 33.64 kg/m. GENERAL:  Well appearing HEENT:  Pupils equal round and reactive, fundi not visualized, oral mucosa unremarkable NECK:  No jugular venous distention, waveform within normal limits, carotid upstroke brisk and symmetric, no bruits, no thyromegaly LYMPHATICS:  No cervical, inguinal adenopathy LUNGS:  Clear to auscultation bilaterally BACK:  No CVA tenderness CHEST:   Unremarkable HEART:  PMI not displaced or sustained,S1 and S2 within normal limits, no S3, no S4, no clicks, no rubs, no murmurs ABD:  Flat, positive bowel sounds normal in frequency in pitch, no bruits, no rebound, no guarding, no midline pulsatile mass, no hepatomegaly, no splenomegaly EXT:  2 plus pulses throughout, no edema, no cyanosis no clubbing SKIN:  No rashes no nodules NEURO:  Cranial nerves II through XII grossly intact, motor grossly intact throughout PSYCH:  Cognitively intact, oriented to person place and time    EKG:  EKG is ordered today. The ekg ordered today demonstrates sinus rhythm, rate 73, axis within normal limits, intervals within normal limit, no acute ST-T wave changes.   Recent Labs: 03/21/2021: ALT 36; BUN 17; Creatinine, Ser 0.88; Potassium 3.7; Sodium 137    Lipid Panel    Component Value Date/Time   CHOL 138 03/21/2021 0753   TRIG 216.0 (H) 03/21/2021 0753   HDL 33.00 (L) 03/21/2021 0753   CHOLHDL 4 03/21/2021 0753   VLDL 43.2 (H) 03/21/2021 0753   LDLCALC 88 03/07/2020 0813   LDLDIRECT 80.0 03/21/2021 0753      Wt Readings from Last 3 Encounters:  05/21/21 214 lb 12.8 oz (97.4 kg)  03/24/21 217 lb (98.4 kg)  12/13/20 229 lb (103.9 kg)      Other studies Reviewed: Additional studies/ records that were reviewed today include: Labs. Review of the above records demonstrates:  Please see elsewhere in the note.     ASSESSMENT AND PLAN:  CORONARY CALCIUM: He had elevated coronary calcium previously.  He has very significant risk factors.  I am going to repeat a coronary calcium score.  This will help me determine goals of therapy.  This will determine further testing with a low threshold to order POET (Plain Old Exercise Treadmill).  We talked about the Mediterranean plant-based diet.  We talked about the need for exercise and a specific prescription for this.  DM: A1c was 7.3.  I suggested that the goal should be closer to 6.5 and I will defer  to 12/15/20, MD  HTN: Blood pressure is well controlled.  No change in therapy.  DYSLIPIDEMIA: LDL is 88.  This might be acceptable with some dietary changes depending on his calcium score above.  Another consideration is if he has very significant increase in his calcium over 5 years I would measure an LP(a).  Current medicines are reviewed at length with the patient today.  The patient does not have concerns regarding medicines.  The following changes have been made:  no change  Labs/ tests ordered today include:   Orders Placed This Encounter  Procedures   CT CARDIAC SCORING (SELF PAY ONLY)      Disposition:   FU with as needed based on the results of the above.   Signed, Rollene Rotunda, MD  05/21/2021 2:43 PM    [facility]

## 2021-05-21 ENCOUNTER — Encounter: Payer: Self-pay | Admitting: Cardiology

## 2021-05-21 ENCOUNTER — Ambulatory Visit: Payer: BC Managed Care – PPO | Admitting: Cardiology

## 2021-05-21 ENCOUNTER — Other Ambulatory Visit: Payer: Self-pay

## 2021-05-21 VITALS — BP 123/78 | HR 73 | Ht 67.0 in | Wt 214.8 lb

## 2021-05-21 DIAGNOSIS — E785 Hyperlipidemia, unspecified: Secondary | ICD-10-CM | POA: Diagnosis not present

## 2021-05-21 DIAGNOSIS — R0602 Shortness of breath: Secondary | ICD-10-CM

## 2021-05-21 DIAGNOSIS — E118 Type 2 diabetes mellitus with unspecified complications: Secondary | ICD-10-CM | POA: Diagnosis not present

## 2021-05-21 DIAGNOSIS — I10 Essential (primary) hypertension: Secondary | ICD-10-CM

## 2021-05-21 NOTE — Patient Instructions (Signed)
Medication Instructions:  Your physician recommends that you continue on your current medications as directed. Please refer to the Current Medication list given to you today.  *If you need a refill on your cardiac medications before your next appointment, please call your pharmacy*   Lab Work: None ordered.     Testing/Procedures: Dr. Hochrein has ordered a CT coronary calcium score. This test is done at [address]. This is $99 out of pocket.   Coronary CalciumScan A coronary calcium scan is an imaging test used to look for deposits of calcium and other fatty materials (plaques) in the inner lining of the blood vessels of the heart (coronary arteries). These deposits of calcium and plaques can partly clog and narrow the coronary arteries without producing any symptoms or warning signs. This puts a person at risk for a heart attack. This test can detect these deposits before symptoms develop. Tell a health care provider about: Any allergies you have. All medicines you are taking, including vitamins, herbs, eye drops, creams, and over-the-counter medicines. Any problems you or family members have had with anesthetic medicines. Any blood disorders you have. Any surgeries you have had. Any medical conditions you have. Whether you are pregnant or may be pregnant. What are the risks? Generally, this is a safe procedure. However, problems may occur, including: Harm to a pregnant woman and her unborn baby. This test involves the use of radiation. Radiation exposure can be dangerous to a pregnant woman and her unborn baby. If you are pregnant, you generally should not have this procedure done. Slight increase in the risk of cancer. This is because of the radiation involved in the test. What happens before the procedure? No preparation is needed for this procedure. What happens during the procedure? You will undress and remove any jewelry around your neck or chest. You  will put on a hospital gown. Sticky electrodes will be placed on your chest. The electrodes will be connected to an electrocardiogram (ECG) machine to record a tracing of the electrical activity of your heart. A CT scanner will take pictures of your heart. During this time, you will be asked to lie still and hold your breath for 2-3 seconds while a picture of your heart is being taken. The procedure may vary among health care providers and hospitals. What happens after the procedure? You can get dressed. You can return to your normal activities. It is up to you to get the results of your test. Ask your health care provider, or the department that is doing the test, when your results will be ready. Summary A coronary calcium scan is an imaging test used to look for deposits of calcium and other fatty materials (plaques) in the inner lining of the blood vessels of the heart (coronary arteries). Generally, this is a safe procedure. Tell your health care provider if you are pregnant or may be pregnant. No preparation is needed for this procedure. A CT scanner will take pictures of your heart. You can return to your normal activities after the scan is done. This information is not intended to replace advice given to you by your health care provider. Make sure you discuss any questions you have with your health care provider. Document Released: 05/14/2008 Document Revised: 10/05/2016 Document Reviewed: 10/05/2016 Elsevier Interactive Patient Education  2017 Elsevier Inc.    Follow-Up: At [facility], you and your health needs are our priority.  As part of our continuing mission to provide you with   exceptional heart care, we have created designated Provider Care Teams.  These Care Teams include your primary Cardiologist (physician) and Advanced Practice Providers (APPs -  Physician Assistants and Nurse Practitioners) who all work together to provide you with the care you need, when you need  it.  We recommend signing up for the patient portal called "MyChart".  Sign up information is provided on this After Visit Summary.  MyChart is used to connect with patients for Virtual Visits (Telemedicine).  Patients are able to view lab/test results, encounter notes, upcoming appointments, etc.  Non-urgent messages can be sent to your provider as well.   To learn more about what you can do with MyChart, go to [email or web address].    Your next appointment:   As needed.   The format for your next appointment:   In Person  Provider:   James Hochrein, MD    

## 2021-05-30 NOTE — Addendum Note (Signed)
Addended by: Myna Hidalgo A on: 05/30/2021 11:33 AM   Modules accepted: Orders

## 2021-06-06 ENCOUNTER — Ambulatory Visit (INDEPENDENT_AMBULATORY_CARE_PROVIDER_SITE_OTHER)
Admission: RE | Admit: 2021-06-06 | Discharge: 2021-06-06 | Disposition: A | Discharge status: Home / Self Care | Payer: Self-pay | Source: Ambulatory Visit | Attending: Cardiology | Admitting: Cardiology

## 2021-06-06 ENCOUNTER — Other Ambulatory Visit: Payer: Self-pay

## 2021-06-06 DIAGNOSIS — R0602 Shortness of breath: Secondary | ICD-10-CM

## 2021-06-10 ENCOUNTER — Telehealth: Payer: Self-pay | Admitting: Cardiology

## 2021-06-10 NOTE — Telephone Encounter (Signed)
Patient returning a call to discuss Calcium CT results

## 2021-06-10 NOTE — Telephone Encounter (Signed)
Patient calling back for CT results. 

## 2021-06-11 ENCOUNTER — Other Ambulatory Visit: Payer: Self-pay

## 2021-06-11 DIAGNOSIS — R0602 Shortness of breath: Secondary | ICD-10-CM

## 2021-06-11 MED ORDER — ATORVASTATIN CALCIUM 80 MG PO TABS: 80.0000 mg | ORAL_TABLET | Freq: Every day | ORAL | 1 refills | Status: DC

## 2021-06-11 NOTE — Telephone Encounter (Signed)
See result note.  

## 2021-06-11 NOTE — Telephone Encounter (Signed)
Follow up:     Patient returning a call back from yesterday for results. 

## 2021-06-13 ENCOUNTER — Telehealth (HOSPITAL_COMMUNITY): Payer: Self-pay | Admitting: *Deleted

## 2021-06-13 NOTE — Telephone Encounter (Signed)
Close encounter 

## 2021-06-17 ENCOUNTER — Ambulatory Visit (HOSPITAL_COMMUNITY)
Admission: RE | Admit: 2021-06-17 | Discharge: 2021-06-17 | Disposition: A | Discharge status: Home / Self Care | Payer: BC Managed Care – PPO | Source: Ambulatory Visit | Attending: Internal Medicine | Admitting: Internal Medicine

## 2021-06-17 ENCOUNTER — Other Ambulatory Visit: Payer: Self-pay

## 2021-06-17 DIAGNOSIS — R0602 Shortness of breath: Secondary | ICD-10-CM | POA: Insufficient documentation

## 2021-06-17 LAB — EXERCISE TOLERANCE TEST
Estimated workload: 10.1 METS
Exercise duration (min): 8 min
Exercise duration (sec): 0 s
MPHR: 155 {beats}/min
Peak HR: 155 {beats}/min
Percent HR: 100 %
Rest HR: 79 {beats}/min

## 2021-07-07 ENCOUNTER — Other Ambulatory Visit: Payer: Self-pay | Admitting: Family Medicine

## 2021-08-21 DIAGNOSIS — Z79899 Other long term (current) drug therapy: Secondary | ICD-10-CM

## 2021-08-21 DIAGNOSIS — E785 Hyperlipidemia, unspecified: Secondary | ICD-10-CM

## 2021-08-26 NOTE — Telephone Encounter (Signed)
Called pt and informed of providers result & recommendations. Pt verbalized understanding. He states that he is not sure when he started the atorvastatin. He states "sometime in June or July" . Pt will come in fasting by the end of this week for fasting Lpa. He will await results.

## 2021-08-28 DIAGNOSIS — Z79899 Other long term (current) drug therapy: Secondary | ICD-10-CM | POA: Diagnosis not present

## 2021-08-28 DIAGNOSIS — E785 Hyperlipidemia, unspecified: Secondary | ICD-10-CM | POA: Diagnosis not present

## 2021-08-29 LAB — LIPOPROTEIN A (LPA): Lipoprotein (a): 15.9 nmol/L (ref <75.0)

## 2021-09-09 ENCOUNTER — Other Ambulatory Visit: Payer: Self-pay | Admitting: Family Medicine

## 2021-12-09 ENCOUNTER — Other Ambulatory Visit: Payer: Self-pay | Admitting: Cardiology

## 2021-12-19 DIAGNOSIS — E119 Type 2 diabetes mellitus without complications: Secondary | ICD-10-CM | POA: Diagnosis not present

## 2021-12-19 DIAGNOSIS — H35033 Hypertensive retinopathy, bilateral: Secondary | ICD-10-CM | POA: Diagnosis not present

## 2021-12-19 LAB — HM DIABETES EYE EXAM

## 2022-03-12 ENCOUNTER — Other Ambulatory Visit: Payer: Self-pay | Admitting: Family Medicine

## 2022-03-30 ENCOUNTER — Encounter: Payer: Self-pay | Admitting: Family Medicine

## 2022-03-30 ENCOUNTER — Ambulatory Visit: Payer: BC Managed Care – PPO | Admitting: Family Medicine

## 2022-03-30 VITALS — BP 122/80 | HR 70 | Temp 97.2°F | Ht 67.0 in | Wt 215.0 lb

## 2022-03-30 DIAGNOSIS — E119 Type 2 diabetes mellitus without complications: Secondary | ICD-10-CM

## 2022-03-30 DIAGNOSIS — E785 Hyperlipidemia, unspecified: Secondary | ICD-10-CM

## 2022-03-30 DIAGNOSIS — Z0000 Encounter for general adult medical examination without abnormal findings: Secondary | ICD-10-CM | POA: Diagnosis not present

## 2022-03-30 DIAGNOSIS — I10 Essential (primary) hypertension: Secondary | ICD-10-CM

## 2022-03-30 DIAGNOSIS — Z23 Encounter for immunization: Secondary | ICD-10-CM | POA: Diagnosis not present

## 2022-03-30 DIAGNOSIS — Z7189 Other specified counseling: Secondary | ICD-10-CM

## 2022-03-30 LAB — COMPREHENSIVE METABOLIC PANEL
ALT: 44 U/L (ref 0–53)
AST: 31 U/L (ref 0–37)
Albumin: 4.4 g/dL (ref 3.5–5.2)
Alkaline Phosphatase: 52 U/L (ref 39–117)
BUN: 14 mg/dL (ref 6–23)
CO2: 31 mEq/L (ref 19–32)
Calcium: 9.7 mg/dL (ref 8.4–10.5)
Chloride: 99 mEq/L (ref 96–112)
Creatinine, Ser: 0.89 mg/dL (ref 0.40–1.50)
GFR: 89.5 mL/min (ref >60.00)
Glucose, Bld: 169 mg/dL — ABNORMAL HIGH (ref 70–99)
Potassium: 4.1 mEq/L (ref 3.5–5.1)
Sodium: 137 mEq/L (ref 135–145)
Total Bilirubin: 1.2 mg/dL (ref 0.2–1.2)
Total Protein: 7 g/dL (ref 6.0–8.3)

## 2022-03-30 LAB — CBC WITH DIFFERENTIAL/PLATELET
Basophils Absolute: 0.1 10*3/uL (ref 0.0–0.1)
Basophils Relative: 0.9 % (ref 0.0–3.0)
Eosinophils Absolute: 0.1 10*3/uL (ref 0.0–0.7)
Eosinophils Relative: 2 % (ref 0.0–5.0)
HCT: 45.5 % (ref 39.0–52.0)
Hemoglobin: 15.4 g/dL (ref 13.0–17.0)
Lymphocytes Relative: 27.9 % (ref 12.0–46.0)
Lymphs Abs: 1.7 10*3/uL (ref 0.7–4.0)
MCHC: 33.9 g/dL (ref 30.0–36.0)
MCV: 94.1 fl (ref 78.0–100.0)
Monocytes Absolute: 0.6 10*3/uL (ref 0.1–1.0)
Monocytes Relative: 9.3 % (ref 3.0–12.0)
Neutro Abs: 3.6 10*3/uL (ref 1.4–7.7)
Neutrophils Relative %: 59.9 % (ref 43.0–77.0)
Platelets: 273 10*3/uL (ref 150.0–400.0)
RBC: 4.83 Mil/uL (ref 4.22–5.81)
RDW: 12.9 % (ref 11.5–15.5)
WBC: 6.1 10*3/uL (ref 4.0–10.5)

## 2022-03-30 LAB — LIPID PANEL
Cholesterol: 130 mg/dL (ref 0–200)
HDL: 37.3 mg/dL — ABNORMAL LOW (ref >39.00)
LDL Cholesterol: 53 mg/dL (ref 0–99)
NonHDL: 92.75
Total CHOL/HDL Ratio: 3
Triglycerides: 197 mg/dL — ABNORMAL HIGH (ref 0.0–149.0)
VLDL: 39.4 mg/dL (ref 0.0–40.0)

## 2022-03-30 LAB — MICROALBUMIN / CREATININE URINE RATIO
Creatinine,U: 80.3 mg/dL
Microalb Creat Ratio: 0.9 mg/g (ref 0.0–30.0)
Microalb, Ur: <0.7 mg/dL (ref 0.0–1.9)

## 2022-03-30 LAB — HEMOGLOBIN A1C: Hgb A1c MFr Bld: 7.1 % — ABNORMAL HIGH (ref 4.6–6.5)

## 2022-03-30 MED ORDER — ATENOLOL-CHLORTHALIDONE 50-25 MG PO TABS: 0.5000 | ORAL_TABLET | Freq: Every day | ORAL | 3 refills | Status: DC

## 2022-03-30 NOTE — Progress Notes (Signed)
CPE- See plan.  Routine anticipatory guidance given to patient.  See health maintenance.  The possibility exists that previously documented standard health maintenance information may have been brought forward from a previous encounter into this note.  If needed, that same information has been updated to reflect the current situation based on today's encounter.   ? ?Tetanus 2013  ?Flu 2022 ?PNA 2023 ?Shingles d/w pt. ?covid vaccine prev done.   ?Colonoscopy 2019 ?Prostate cancer screening and PSA options (with potential risks and benefits of testing vs not testing) were discussed along with recent recs/guidelines.  He declined testing PSA at this point. ?Living will d/w pt.  Wife designated if patient were incapacitated.   ? 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   ? ?Diabetes:  ?Using medications without difficulties: yes ?Hypoglycemic episodes:no sx ?Hyperglycemic episodes: no sx ?Feet problems: no ?Blood Sugars averaging: checked episodically, ~140.   ?eye exam within last year:yes ?Labs pending.  ? ?Elevated Cholesterol: ?Using medications without problems:yes ?Muscle aches: no ?Diet compliance: yes ?Exercise: yes ?Labs pending.  ? ?Hypertension:    ?Using medication without problems or lightheadedness: yes ?Chest pain with exertion:no ?Edema:no ?Short of breath:no ?Average home BPs:no ? ?PMH and SH reviewed ? ?Meds, vitals, and allergies reviewed.  ? ?ROS: Per HPI.  Unless specifically indicated otherwise in HPI, the patient denies: ? ?General: fever. ?Eyes: acute vision changes ?ENT: sore throat ?Cardiovascular: chest pain ?Respiratory: SOB ?GI: vomiting ?GU: dysuria ?Musculoskeletal: acute back pain ?Derm: acute rash ?Neuro: acute motor dysfunction ?Psych: worsening mood ?Endocrine: polydipsia ?Heme: bleeding ?Allergy: hayfever ? ?GEN: nad, alert and oriented ?HEENT: ncat ?NECK: supple w/o LA ?CV: rrr. ?PULM: ctab, no inc wob ?ABD: soft, +bs ?EXT: no edema ?SKIN: no  acute rash ? ?Diabetic foot exam: ?Normal inspection ?No skin breakdown ?No calluses  ?Normal DP pulses ?Normal sensation to light touch and monofilament ?Nails normal ?

## 2022-03-30 NOTE — Patient Instructions (Addendum)
PNA 20 vaccine today.  ?Check with your insurance to see if they will cover the shingles shot. ?I would get a flu shot each fall.   ?Reasonable to get a tetanus shot next year.   ?Take care.  Glad to see you. ? ?Go to the lab on the way out.   If you have mychart we'll likely use that to update you.    ? ?Plan on recheck in about 6 months.  A1c at the visit.   ?

## 2022-04-01 NOTE — Assessment & Plan Note (Signed)
Continue work on diet and exercise.  Recheck periodically.  Continue metformin.  See notes on labs. ?

## 2022-04-01 NOTE — Assessment & Plan Note (Signed)
Continue work on diet and exercise.  Recheck periodically.  Continue atorvastatin.  See notes on labs. ?

## 2022-04-01 NOTE — Assessment & Plan Note (Signed)
Continue work on diet and exercise.  Recheck periodically.  Continue atenolol chlorthalidone.  See notes on labs. ?

## 2022-04-01 NOTE — Assessment & Plan Note (Signed)
Living will d/w pt.  Wife designated if patient were incapacitated.   ?

## 2022-04-01 NOTE — Assessment & Plan Note (Signed)
Tetanus 2013  ?Flu 2022 ?PNA 2023 ?Shingles d/w pt. ?covid vaccine prev done.   ?Colonoscopy 2019 ?Prostate cancer screening and PSA options (with potential risks and benefits of testing vs not testing) were discussed along with recent recs/guidelines.  He declined testing PSA at this point. ?Living will d/w pt.  Wife designated if patient were incapacitated.   ?

## 2022-05-07 ENCOUNTER — Ambulatory Visit: Payer: BC Managed Care – PPO | Admitting: Physician Assistant

## 2022-05-07 ENCOUNTER — Encounter: Payer: Self-pay | Admitting: Physician Assistant

## 2022-05-07 VITALS — BP 110/72 | HR 71 | Temp 97.9°F | Ht 67.0 in | Wt 212.5 lb

## 2022-05-07 DIAGNOSIS — R21 Rash and other nonspecific skin eruption: Secondary | ICD-10-CM | POA: Diagnosis not present

## 2022-05-07 DIAGNOSIS — E119 Type 2 diabetes mellitus without complications: Secondary | ICD-10-CM | POA: Diagnosis not present

## 2022-05-07 MED ORDER — HYDROXYZINE HCL 10 MG PO TABS: 10.0000 mg | ORAL_TABLET | Freq: Three times a day (TID) | ORAL | 0 refills | Status: DC | PRN

## 2022-05-07 MED ORDER — PREDNISONE 10 MG PO TABS: ORAL_TABLET | ORAL | 0 refills | Status: DC

## 2022-05-07 NOTE — Patient Instructions (Signed)
It was great to see you!  Start prednisone 20 mg x 1 week, then 10 mg x 1 week. Monitor your blood sugars while on this  Continue claritin  Start as needed hydroxyzine for itching - this will replace benadryl  If new/worsening symptoms, let us know    Take care,  Jarold Motto PA-C

## 2022-05-07 NOTE — Progress Notes (Signed)
James Weaver is a 66 y.o. male here for a new problem.  History of Present Illness:   Chief Complaint  Patient presents with   Rash    Pt says started itching last night, and today noticed rash on both arms, chest and groin area, tops of feet. He took Chlortrimeton this morning.     HPI  Rash Patient complain of rash that has been onset since this morning. States his symptoms started with itching last night. He noticed rash this morning which is located on both arms, chest, both legs and groin area. States he was bitten by tick about 1-2 weeks ago. Has had severe itching since last night. Reports he has not been outside for the past week. He has tried chlortrimeton with no relief. States he takes Claritin everyday for his other allergies. He has taken Claritin this morning. He has had some stress-related hives in the past. He saw dermatologist for this issue in the past, was given valium. No recent illness. No sick contacts. No food allergies. No fever or chills. No joint pain or neck pain.   Diabetes Current DM meds: Metformin 500 mg twice daily. Fasting blood sugars at home are: in the 150's. Patient is compliant with medications. Denies: hypoglycemic or hyperglycemic episodes or symptoms.Denies any worsening symptoms.  Last A1c 7.1%.  Past Medical History:  Diagnosis Date   Diabetes mellitus without complication (HCC)    Hyperlipemia    Hypertension      Social History   Tobacco Use   Smoking status: Never   Smokeless tobacco: Never  Substance Use Topics   Alcohol use: No    Alcohol/week: 0.0 standard drinks of alcohol   Drug use: No    Past Surgical History:  Procedure Laterality Date   ANTERIOR CRUCIATE LIGAMENT REPAIR  Jan '11   left knee   arthroscopic  knee surg     left 2000   COLONOSCOPY      Family History  Problem Relation Age of Onset   Hypertension Mother    Hyperlipidemia Mother    Diabetes Mother    Early death Father 51       Heart attack    Heart disease Brother        CABG   Hyperlipidemia Brother    Hypertension Brother    Heart disease Brother 79       CABG '11   Hyperlipidemia Brother    Hypertension Brother    Hyperlipidemia Brother    Hypertension Brother    Cancer Brother        glioblastoma multiforme   Heart disease Paternal Grandfather    Stroke Neg Hx    Prostate cancer Neg Hx    Colon cancer Neg Hx    Colon polyps Neg Hx    Rectal cancer Neg Hx    Stomach cancer Neg Hx     No Known Allergies  Current Medications:   Current Outpatient Medications:    ACCU-CHEK FASTCLIX LANCETS MISC, Use to check blood sugar daily.  Diagnosis:  E11.9   Non insulin dependent., Disp: , Rfl:    aspirin 81 MG tablet, Take 81 mg by mouth daily., Disp: , Rfl:    atenolol-chlorthalidone (TENORETIC) 50-25 MG tablet, Take 0.5 tablets by mouth daily., Disp: 45 tablet, Rfl: 3   atorvastatin (LIPITOR) 80 MG tablet, TAKE 1 TABLET BY MOUTH EVERY DAY, Disp: 90 tablet, Rfl: 3   fluticasone (FLONASE) 50 MCG/ACT nasal spray, Place 2 sprays into both nostrils  daily., Disp: 16 g, Rfl: 6   folic acid (FOLVITE) 1 MG tablet, Take 1 mg by mouth daily., Disp: , Rfl:    glucose blood (ACCU-CHEK GUIDE) test strip, USE AS INSTRUCTED TO CHECK BLOOD SUGAR DAILY., Disp: 100 strip, Rfl: 0   loratadine (CLARITIN) 10 MG tablet, Take 10 mg by mouth daily., Disp: , Rfl:    metFORMIN (GLUCOPHAGE) 500 MG tablet, TAKE 1 TABLET BY MOUTH TWICE A DAY WITH MEALS, Disp: 180 tablet, Rfl: 2   sildenafil (REVATIO) 20 MG tablet, TAKE 1 TO 5 TABLETS BY MOUTH DAILY AS NEEDED, Disp: 50 tablet, Rfl: 2   vitamin E 400 UNIT capsule, Take 400 Units by mouth daily., Disp: , Rfl:    Review of Systems:   ROS Negative unless otherwise specified per HPI.   Vitals:   Vitals:   05/07/22 1123  BP: 110/72  Pulse: 71  Temp: 97.9 F (36.6 C)  TempSrc: Temporal  SpO2: 95%  Weight: 212 lb 8 oz (96.4 kg)  Height: 5\' 7"  (1.702 m)     Body mass index is 33.28  kg/m.  Physical Exam:   Physical Exam Vitals and nursing note reviewed.  Constitutional:      Appearance: He is well-developed.  HENT:     Head: Normocephalic.  Eyes:     Conjunctiva/sclera: Conjunctivae normal.     Pupils: Pupils are equal, round, and reactive to light.  Pulmonary:     Effort: Pulmonary effort is normal.  Musculoskeletal:        General: Normal range of motion.     Cervical back: Normal range of motion.  Skin:    General: Skin is warm and dry.     Comments: Erythematous patches concentrated to lower torso, inner thighs -- with some mild involvement in b/l forearms and shins  No evidence of any target-appearing lesions  Neurological:     Mental Status: He is alert and oriented to person, place, and time.  Psychiatric:        Behavior: Behavior normal.        Thought Content: Thought content normal.        Judgment: Judgment normal.     Assessment and Plan:   Rash and nonspecific skin eruption No red flags No signs of systemic illness Suspect contact dermatitis Start prednisone 20 mg x 1 week, then 10 mg x 1 week -- monitor blood sugars while on this Continue claritin, take prn hydroxyzine for this If new symptoms or concerns, follow-up with [place]  Diabetes mellitus without complication ([place]) Discussed need to monitor blood sugar and symptoms while on prednisone Follow-up as needed, call with concerns  I,Savera Zaman,acting as a scribe for [employer], PA.,have documented all relevant documentation on the behalf of Inda Coke, PA,as directed by  Inda Coke, PA while in the presence of Inda Coke, [place].   I, Inda Coke, [place], have reviewed all documentation for this visit. The documentation on 05/07/22 for the exam, diagnosis, procedures, and orders are all accurate and complete.  Inda Coke, PA-C

## 2022-07-10 ENCOUNTER — Encounter: Payer: Self-pay | Admitting: Family Medicine

## 2022-07-10 MED ORDER — SILDENAFIL CITRATE 20 MG PO TABS: ORAL_TABLET | ORAL | 2 refills | Status: DC

## 2022-10-01 ENCOUNTER — Ambulatory Visit: Payer: BC Managed Care – PPO | Admitting: Family Medicine

## 2022-10-05 ENCOUNTER — Encounter: Payer: Self-pay | Admitting: Family Medicine

## 2022-10-05 ENCOUNTER — Ambulatory Visit (INDEPENDENT_AMBULATORY_CARE_PROVIDER_SITE_OTHER): Payer: Medicare HMO | Admitting: Family Medicine

## 2022-10-05 VITALS — BP 106/62 | HR 77 | Temp 97.4°F | Ht 67.0 in | Wt 213.0 lb

## 2022-10-05 DIAGNOSIS — E119 Type 2 diabetes mellitus without complications: Secondary | ICD-10-CM

## 2022-10-05 LAB — POCT GLYCOSYLATED HEMOGLOBIN (HGB A1C): Hemoglobin A1C: 7.1 % — AB (ref 4.0–5.6)

## 2022-10-05 MED ORDER — METFORMIN HCL 500 MG PO TABS: 500.0000 mg | ORAL_TABLET | Freq: Two times a day (BID) | ORAL | 3 refills | Status: DC

## 2022-10-05 NOTE — Progress Notes (Unsigned)
Diabetes:  Using medications without difficulties: yes Hypoglycemic episodes:no Hyperglycemic episodes:no Feet problems: no Blood Sugars averaging: ~140s A1c stable 7.1, d/w pt at [facility].  eye exam within last year:  yes Diet and exercise d/w pt.  His son, daughter in law, and their 4 kids moved in with patient.   He is leaving for [place] later this year, then [place].    Meds, vitals, and allergies reviewed.  ROS: Per HPI unless specifically indicated in ROS section   GEN: nad, alert and oriented HEENT: ncat NECK: supple w/o LA CV: rrr. PULM: ctab, no inc wob ABD: soft, +bs EXT: no edema SKIN: no acute rash

## 2022-10-05 NOTE — Patient Instructions (Addendum)
If your weight is going up or if your sugar is persistently above 150 in the AM, then increase to 2 metformin in the AM and 1 in the PM.   Plan on recheck in about 6 months at a yearly visit.   Update me as needed.   Take care.  Glad to see you.

## 2022-10-07 ENCOUNTER — Other Ambulatory Visit: Payer: Self-pay | Admitting: Family Medicine

## 2022-10-07 NOTE — Assessment & Plan Note (Signed)
Discussed diet and exercise. If weight is going up or if your sugar is persistently above 150 in the AM, then increase to 2 metformin in the AM and 1 in the PM.   Plan on recheck in about 6 months at a yearly visit.

## 2022-10-08 MED ORDER — ACCU-CHEK GUIDE VI STRP: ORAL_STRIP | 0 refills | Status: DC

## 2022-10-08 MED ORDER — ACCU-CHEK FASTCLIX LANCETS MISC: 3 refills | Status: AC

## 2022-10-13 ENCOUNTER — Other Ambulatory Visit: Payer: Self-pay | Admitting: Family Medicine

## 2022-12-18 ENCOUNTER — Other Ambulatory Visit: Payer: Self-pay | Admitting: Family Medicine

## 2023-02-24 ENCOUNTER — Telehealth: Payer: Self-pay | Admitting: Family Medicine

## 2023-02-24 MED ORDER — METFORMIN HCL 500 MG PO TABS: 500.0000 mg | ORAL_TABLET | Freq: Three times a day (TID) | ORAL | 3 refills | Status: DC

## 2023-02-24 NOTE — Telephone Encounter (Signed)
Patient called in and stated that he currently taking metFORMIN (GLUCOPHAGE) 500 MG tablet 3 times a day instead of 1. He stated that he is running out the medication faster and the pharmacy will not refill it yet. He is needing a new prescription to be sent over to the pharmacy stating he takes it 3 times a day. Please advise. Thank you!

## 2023-02-24 NOTE — Addendum Note (Signed)
Addended by: Tonia Ghent on: 02/24/2023 04:11 PM   Modules accepted: Orders

## 2023-03-15 DIAGNOSIS — H2513 Age-related nuclear cataract, bilateral: Secondary | ICD-10-CM | POA: Diagnosis not present

## 2023-03-15 DIAGNOSIS — H52223 Regular astigmatism, bilateral: Secondary | ICD-10-CM | POA: Diagnosis not present

## 2023-03-15 DIAGNOSIS — H524 Presbyopia: Secondary | ICD-10-CM | POA: Diagnosis not present

## 2023-03-15 DIAGNOSIS — H5213 Myopia, bilateral: Secondary | ICD-10-CM | POA: Diagnosis not present

## 2023-03-15 DIAGNOSIS — H53143 Visual discomfort, bilateral: Secondary | ICD-10-CM | POA: Diagnosis not present

## 2023-03-15 LAB — HM DIABETES EYE EXAM

## 2023-03-28 ENCOUNTER — Other Ambulatory Visit: Payer: Self-pay | Admitting: Family Medicine

## 2023-03-28 DIAGNOSIS — E119 Type 2 diabetes mellitus without complications: Secondary | ICD-10-CM

## 2023-03-29 ENCOUNTER — Other Ambulatory Visit (INDEPENDENT_AMBULATORY_CARE_PROVIDER_SITE_OTHER): Payer: Medicare HMO

## 2023-03-29 DIAGNOSIS — E119 Type 2 diabetes mellitus without complications: Secondary | ICD-10-CM

## 2023-03-29 LAB — COMPREHENSIVE METABOLIC PANEL
ALT: 34 U/L (ref 0–53)
AST: 27 U/L (ref 0–37)
Albumin: 4.2 g/dL (ref 3.5–5.2)
Alkaline Phosphatase: 46 U/L (ref 39–117)
BUN: 14 mg/dL (ref 6–23)
CO2: 34 mEq/L — ABNORMAL HIGH (ref 19–32)
Calcium: 9.5 mg/dL (ref 8.4–10.5)
Chloride: 97 mEq/L (ref 96–112)
Creatinine, Ser: 0.85 mg/dL (ref 0.40–1.50)
GFR: 90.11 mL/min (ref >60.00)
Glucose, Bld: 149 mg/dL — ABNORMAL HIGH (ref 70–99)
Potassium: 4.4 mEq/L (ref 3.5–5.1)
Sodium: 138 mEq/L (ref 135–145)
Total Bilirubin: 0.9 mg/dL (ref 0.2–1.2)
Total Protein: 6.7 g/dL (ref 6.0–8.3)

## 2023-03-29 LAB — CBC WITH DIFFERENTIAL/PLATELET
Basophils Absolute: 0 10*3/uL (ref 0.0–0.1)
Basophils Relative: 0.5 % (ref 0.0–3.0)
Eosinophils Absolute: 0.2 10*3/uL (ref 0.0–0.7)
Eosinophils Relative: 2.8 % (ref 0.0–5.0)
HCT: 45.1 % (ref 39.0–52.0)
Hemoglobin: 15.5 g/dL (ref 13.0–17.0)
Lymphocytes Relative: 30.7 % (ref 12.0–46.0)
Lymphs Abs: 1.9 10*3/uL (ref 0.7–4.0)
MCHC: 34.4 g/dL (ref 30.0–36.0)
MCV: 94.1 fl (ref 78.0–100.0)
Monocytes Absolute: 0.6 10*3/uL (ref 0.1–1.0)
Monocytes Relative: 9.9 % (ref 3.0–12.0)
Neutro Abs: 3.4 10*3/uL (ref 1.4–7.7)
Neutrophils Relative %: 56.1 % (ref 43.0–77.0)
Platelets: 266 10*3/uL (ref 150.0–400.0)
RBC: 4.79 Mil/uL (ref 4.22–5.81)
RDW: 13 % (ref 11.5–15.5)
WBC: 6 10*3/uL (ref 4.0–10.5)

## 2023-03-29 LAB — MICROALBUMIN / CREATININE URINE RATIO
Creatinine,U: 100.9 mg/dL
Microalb Creat Ratio: 0.7 mg/g (ref 0.0–30.0)
Microalb, Ur: <0.7 mg/dL (ref 0.0–1.9)

## 2023-03-29 LAB — LIPID PANEL
Cholesterol: 118 mg/dL (ref 0–200)
HDL: 34.1 mg/dL — ABNORMAL LOW (ref >39.00)
LDL Cholesterol: 53 mg/dL (ref 0–99)
NonHDL: 84.02
Total CHOL/HDL Ratio: 3
Triglycerides: 155 mg/dL — ABNORMAL HIGH (ref 0.0–149.0)
VLDL: 31 mg/dL (ref 0.0–40.0)

## 2023-03-29 LAB — HEMOGLOBIN A1C: Hgb A1c MFr Bld: 7.3 % — ABNORMAL HIGH (ref 4.6–6.5)

## 2023-04-05 ENCOUNTER — Encounter: Payer: Medicare HMO | Admitting: Family Medicine

## 2023-04-08 ENCOUNTER — Ambulatory Visit (INDEPENDENT_AMBULATORY_CARE_PROVIDER_SITE_OTHER): Payer: Medicare HMO | Admitting: Family Medicine

## 2023-04-08 ENCOUNTER — Encounter: Payer: Self-pay | Admitting: Family Medicine

## 2023-04-08 VITALS — BP 116/80 | HR 78 | Temp 97.5°F | Ht 67.0 in | Wt 210.0 lb

## 2023-04-08 DIAGNOSIS — I10 Essential (primary) hypertension: Secondary | ICD-10-CM

## 2023-04-08 DIAGNOSIS — E118 Type 2 diabetes mellitus with unspecified complications: Secondary | ICD-10-CM

## 2023-04-08 DIAGNOSIS — Z7189 Other specified counseling: Secondary | ICD-10-CM

## 2023-04-08 DIAGNOSIS — N529 Male erectile dysfunction, unspecified: Secondary | ICD-10-CM

## 2023-04-08 DIAGNOSIS — Z0000 Encounter for general adult medical examination without abnormal findings: Secondary | ICD-10-CM | POA: Diagnosis not present

## 2023-04-08 DIAGNOSIS — E785 Hyperlipidemia, unspecified: Secondary | ICD-10-CM

## 2023-04-08 MED ORDER — ATENOLOL-CHLORTHALIDONE 50-25 MG PO TABS: 0.5000 | ORAL_TABLET | Freq: Every day | ORAL | 3 refills | Status: DC

## 2023-04-08 MED ORDER — SILDENAFIL CITRATE 20 MG PO TABS: ORAL_TABLET | ORAL | 12 refills | Status: DC

## 2023-04-08 NOTE — Progress Notes (Signed)
I have personally reviewed the Medicare Annual Wellness questionnaire and have noted 1. The patient's medical and social history 2. Their use of alcohol, tobacco or illicit drugs 3. Their current medications and supplements 4. The patient's functional ability including ADL's, fall risks, home safety risks and hearing or visual             impairment. 5. Diet and physical activities 6. Evidence for depression or mood disorders  The patients weight, height, BMI have been recorded in the chart and visual acuity is per eye clinic.  I have made referrals, counseling and provided education to the patient based review of the above and I have provided the pt with a written personalized care plan for preventive services.  Provider list updated- see scanned forms.  Routine anticipatory guidance given to patient.  See health maintenance. The possibility exists that previously documented standard health maintenance information may have been brought forward from a previous encounter into this note.  If needed, that same information has been updated to reflect the current situation based on today's encounter.    Flu previously done Shingles discussed with patient PNA previously done Tetanus discussed with patient. COVID-vaccine previously done. Colonoscopy 2019 Prostate cancer screening deferred.  Discussed with patient. Advance directive-wife designated if patient were incapacitated. Cognitive function addressed- see scanned forms- and if abnormal then additional documentation follows.   Hearing and vision screening in chart.  EKG in chart.    In addition to [facility], follow up visit for the below conditions:  Diabetes:  Using medications without difficulties: yes, GI cautions d/w pt about re: metformin.  Some occ diarrhea but manageable.   Hypoglycemic episodes:no Hyperglycemic episodes:no Feet problems:no tingling.   Blood Sugars averaging: intermittently checked.  Usually 140s eye  exam within last year: yes  Hypertension:    Using medication without problems or lightheadedness: yes Chest pain with exertion:no Edema:no Short of breath:no  Elevated Cholesterol: Using medications without problems:yes Muscle aches: no Diet compliance: yes Exercise:yes Labs d/w pt.   ED improved with sidenafil w/o ADE on med.    PMH and SH reviewed  Meds, vitals, and allergies reviewed.   ROS: Per HPI.  Unless specifically indicated otherwise in HPI, the patient denies:  General: fever. Eyes: acute vision changes ENT: sore throat Cardiovascular: chest pain Respiratory: SOB GI: vomiting GU: dysuria Musculoskeletal: acute back pain Derm: acute rash Neuro: acute motor dysfunction Psych: worsening mood Endocrine: polydipsia Heme: bleeding Allergy: hayfever  GEN: nad, alert and oriented HEENT: ncat NECK: supple w/o LA CV: rrr. PULM: ctab, no inc wob ABD: soft, +bs EXT: no edema SKIN: no acute rash  Diabetic foot exam: Normal inspection No skin breakdown No calluses  Normal DP pulses Normal sensation to light touch and monofilament Nails normal

## 2023-04-08 NOTE — Patient Instructions (Addendum)
Shingles and tetanus at pharmacy. Recheck with A1c at a visit in about 6 months.  Take care.  Glad to see you.

## 2023-04-10 ENCOUNTER — Other Ambulatory Visit: Payer: Self-pay | Admitting: Family Medicine

## 2023-04-11 NOTE — Assessment & Plan Note (Signed)
Continue work on diet and exercise.  Continue atorvastatin. 

## 2023-04-11 NOTE — Assessment & Plan Note (Signed)
ED improved with sidenafil w/o ADE on med.   Continue sildenafil use as needed.  No nitroglycerin use.

## 2023-04-11 NOTE — Assessment & Plan Note (Signed)
Labs discussed with patient.  He is going to be walking more.  He can manage metformin dose as is.  Continue work on diet. Recheck with A1c at a visit in about 6 months.  No change in medication now.

## 2023-04-11 NOTE — Assessment & Plan Note (Signed)
Advance directive- wife designated if patient were incapacitated.  

## 2023-04-11 NOTE — Assessment & Plan Note (Signed)
Flu previously done Shingles discussed with patient PNA previously done Tetanus discussed with patient. COVID-vaccine previously done. Colonoscopy 2019 Prostate cancer screening deferred.  Discussed with patient. Advance directive-wife designated if patient were incapacitated. Cognitive function addressed- see scanned forms- and if abnormal then additional documentation follows.   Hearing and vision screening in chart.  EKG in chart.

## 2023-04-11 NOTE — Assessment & Plan Note (Signed)
Labs discussed with patient.  Continue atenolol chlorthalidone.

## 2023-08-26 ENCOUNTER — Other Ambulatory Visit: Payer: Self-pay | Admitting: Family Medicine

## 2023-09-27 ENCOUNTER — Ambulatory Visit (INDEPENDENT_AMBULATORY_CARE_PROVIDER_SITE_OTHER): Payer: Medicare HMO | Admitting: Family Medicine

## 2023-09-27 ENCOUNTER — Encounter: Payer: Self-pay | Admitting: Family Medicine

## 2023-09-27 VITALS — BP 118/78 | HR 69 | Temp 98.2°F | Ht 67.0 in | Wt 203.4 lb

## 2023-09-27 DIAGNOSIS — E118 Type 2 diabetes mellitus with unspecified complications: Secondary | ICD-10-CM | POA: Diagnosis not present

## 2023-09-27 DIAGNOSIS — Z23 Encounter for immunization: Secondary | ICD-10-CM | POA: Diagnosis not present

## 2023-09-27 DIAGNOSIS — E119 Type 2 diabetes mellitus without complications: Secondary | ICD-10-CM

## 2023-09-27 DIAGNOSIS — J324 Chronic pansinusitis: Secondary | ICD-10-CM

## 2023-09-27 DIAGNOSIS — Z7984 Long term (current) use of oral hypoglycemic drugs: Secondary | ICD-10-CM

## 2023-09-27 LAB — POCT GLYCOSYLATED HEMOGLOBIN (HGB A1C): Hemoglobin A1C: 6.7 % — AB (ref 4.0–5.6)

## 2023-09-27 MED ORDER — METFORMIN HCL 500 MG PO TABS: ORAL_TABLET | ORAL | Status: DC

## 2023-09-27 MED ORDER — ATORVASTATIN CALCIUM 80 MG PO TABS: 80.0000 mg | ORAL_TABLET | Freq: Every day | ORAL | 3 refills | Status: DC

## 2023-09-27 MED ORDER — FLUTICASONE PROPIONATE 50 MCG/ACT NA SUSP: 2.0000 | Freq: Every day | NASAL | Status: AC

## 2023-09-27 NOTE — Patient Instructions (Addendum)
You could try metformin 2 in the AM and 1 in the PM.   Take care.  Glad to see you. Recheck in yearly visit in April or May.  Labs ahead of time if possible.

## 2023-09-27 NOTE — Progress Notes (Unsigned)
Diabetes:  Using medications without difficulties: yes Hypoglycemic episodes: no Hyperglycemic episodes:no Feet problems:no tingling.   Blood Sugars averaging: not checked often but usually ~130-140s.   eye exam within last year: yes A1c improved to 6.7.  down 7lbs from prior.  Was more active this summer.    Flu shot today.    Meds, vitals, and allergies reviewed.  ROS: Per HPI unless specifically indicated in ROS section   GEN: nad, alert and oriented HEENT: ncat NECK: supple w/o LA CV: rrr. PULM: ctab, no inc wob ABD: soft, +bs EXT: no edema SKIN: no acute rash

## 2023-09-29 NOTE — Assessment & Plan Note (Signed)
A1c improved to 6.7.  down 7lbs from prior.  Was more active this summer.  Continue metformin.  Continue work on diet and exercise.  Recheck periodically.  He agrees to plan.

## 2024-02-21 ENCOUNTER — Other Ambulatory Visit: Payer: Self-pay | Admitting: Family Medicine

## 2024-02-29 ENCOUNTER — Other Ambulatory Visit: Payer: Self-pay | Admitting: Family Medicine

## 2024-03-09 ENCOUNTER — Ambulatory Visit

## 2024-03-09 VITALS — Ht 67.0 in | Wt 203.0 lb

## 2024-03-09 DIAGNOSIS — Z0000 Encounter for general adult medical examination without abnormal findings: Secondary | ICD-10-CM

## 2024-03-09 NOTE — Progress Notes (Signed)
 Subjective:   James Weaver is a 68 y.o. who presents for a Medicare Wellness preventive visit.  Visit Complete: Virtual I connected with  Kealan Buchan on 03/09/24 by a audio enabled telemedicine application and verified that I am speaking with the correct person using two identifiers.  Patient Location: Home  Provider Location: Home Office  I discussed the limitations of evaluation and management by telemedicine. The patient expressed understanding and agreed to proceed.  Vital Signs: Because this visit was a virtual/telehealth visit, some criteria may be missing or patient reported. Any vitals not documented were not able to be obtained and vitals that have been documented are patient reported.  VideoDeclined- This patient declined Librarian, academic. Therefore the visit was completed with audio only.  Persons Participating in Visit: Patient.  AWV Questionnaire: No: Patient Medicare AWV questionnaire was not completed prior to this visit.  Cardiac Risk Factors include: advanced age (>19men, >58 women);obesity (BMI >30kg/m2);diabetes mellitus;dyslipidemia;hypertension;male gender     Objective:    Today's Vitals   03/09/24 0937  Weight: 203 lb (92.1 kg)  Height: 5\' 7"  (1.702 m)   Body mass index is 31.79 kg/m.     03/09/2024    9:53 AM 01/11/2018    8:08 AM 11/26/2017    9:56 AM 03/17/2016   10:05 AM 03/17/2016   10:04 AM 12/10/2015    1:40 PM 10/28/2015    8:35 AM  Advanced Directives  Does Patient Have a Medical Advance Directive? Yes Yes Yes No No No No  Type of Estate agent of [place];Living will  Healthcare Power of [place];Living will      Does patient want to make changes to medical advance directive?  No - Patient declined       Copy of Healthcare Power of Attorney in Chart? No - copy requested        Would patient like information on creating a medical advance directive?    No - patient declined  information No - patient declined information No - patient declined information No - patient declined information    Current Medications (verified) Outpatient Encounter Medications as of 03/09/2024  Medication Sig   Accu-Chek FastClix Lancets MISC Use to check blood sugar daily.  Diagnosis:  E11.9   Non insulin dependent.   aspirin 81 MG tablet Take 81 mg by mouth daily.   atenolol-chlorthalidone (TENORETIC) 50-25 MG tablet Take 0.5 tablets by mouth daily.   atorvastatin (LIPITOR) 80 MG tablet Take 1 tablet (80 mg total) by mouth daily.   BOOSTRIX 5-2.5-18.5 LF-MCG/0.5 injection  (Patient not taking: Reported on 03/09/2024)   fluticasone (FLONASE) 50 MCG/ACT nasal spray Place 2 sprays into both nostrils daily. PRN   folic acid (FOLVITE) 1 MG tablet Take 1 mg by mouth daily.   glucose blood (ACCU-CHEK GUIDE) test strip USE AS INSTRUCTED TO CHECK BLOOD SUGAR DAILY.   loratadine (CLARITIN) 10 MG tablet Take 10 mg by mouth daily.   metFORMIN (GLUCOPHAGE) 500 MG tablet TAKE 1 TABLET (500 MG TOTAL) BY MOUTH IN THE MORNING, AT NOON, AND AT BEDTIME.   sildenafil (REVATIO) 20 MG tablet TAKE 1 TO 5 TABLETS BY MOUTH DAILY AS NEEDED   vitamin E 400 UNIT capsule Take 400 Units by mouth daily.   No facility-administered encounter medications on file as of 03/09/2024.    Allergies (verified) Patient has no known allergies.   History: Past Medical History:  Diagnosis Date   Diabetes mellitus without complication (HCC)  Hyperlipemia    Hypertension    Past Surgical History:  Procedure Laterality Date   ANTERIOR CRUCIATE LIGAMENT REPAIR  Jan '11   left knee   arthroscopic  knee surg     left 2000   COLONOSCOPY     Family History  Problem Relation Age of Onset   Hypertension Mother    Hyperlipidemia Mother    Diabetes Mother    Early death Father 24       Heart attack   Heart disease Brother        CABG   Hyperlipidemia Brother    Hypertension Brother    Heart disease Brother 40        CABG '11   Hyperlipidemia Brother    Hypertension Brother    Hyperlipidemia Brother    Hypertension Brother    Cancer Brother        glioblastoma multiforme   Heart disease Paternal Grandfather    Stroke Son    Prostate cancer Neg Hx    Colon cancer Neg Hx    Colon polyps Neg Hx    Rectal cancer Neg Hx    Stomach cancer Neg Hx    Social History   Socioeconomic History   Marital status: Married    Spouse name: Not on file   Number of children: 2   Years of education: 16   Highest education level: Some college, no degree  Occupational History   Occupation: Education officer, environmental  Tobacco Use   Smoking status: Never   Smokeless tobacco: Never  Substance and Sexual Activity   Alcohol use: Yes    Comment: rare   Drug use: No   Sexual activity: Yes    Partners: Female  Other Topics Concern   Not on file  Social History Narrative   Married '76   2 sons- out of the house.    1st son- living and working as a principal in a school in [place] with department of defense.    2nd son- Archivist, lives with patient.     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   [employer] fan   Social Drivers of Health   Financial Resource Strain: Low Risk  (03/09/2024)   Overall Financial Resource Strain (CARDIA)    Difficulty of Paying Living Expenses: Not hard at all  Food Insecurity: No Food Insecurity (03/09/2024)   Hunger Vital Sign    Worried About Running Out of Food in the Last Year: Never true    Ran Out of Food in the Last Year: Never true  Transportation Needs: No Transportation Needs (03/09/2024)   PRAPARE - Administrator, Civil Service (Medical): No    Lack of Transportation (Non-Medical): No  Physical Activity: Sufficiently Active (03/09/2024)   Exercise Vital Sign    Days of Exercise per Week: 5 days    Minutes of Exercise per Session: 40 min  Stress: No Stress Concern Present (03/09/2024)    [employer] of Occupational Health - Occupational Stress Questionnaire    Feeling of Stress : Not at all  Social Connections: Socially Integrated (03/09/2024)   Social Connection and Isolation Panel [NHANES]    Frequency of Communication with Friends and Family: More than three times a week    Frequency of Social Gatherings with Friends and Family: More than three times a week    Attends Religious Services: More than 4 times per year    Active  Member of Clubs or Organizations: Yes    Attends Engineer, structural: More than 4 times per year    Marital Status: Married    Tobacco Counseling Counseling given: Not Answered    Clinical Intake:  Pre-visit preparation completed: Yes  Pain : No/denies pain     BMI - recorded: 31.79 Nutritional Status: BMI > 30  Obese Nutritional Risks: None Diabetes: Yes CBG done?: No Did pt. bring in CBG monitor from home?: No  Lab Results  Component Value Date   HGBA1C 6.7 (A) 09/27/2023   HGBA1C 7.3 (H) 03/29/2023   HGBA1C 7.1 (A) 10/05/2022     How often do you need to have someone help you when you read instructions, pamphlets, or other written materials from your doctor or pharmacy?: 1 - Never  Interpreter Needed?: No  Comments: lives with wife Information entered by :: B.Jasir Rother,LPN   Activities of Daily Living     03/09/2024    9:54 AM  In your present state of health, do you have any difficulty performing the following activities:  Hearing? 0  Vision? 0  Difficulty concentrating or making decisions? 0  Walking or climbing stairs? 0  Dressing or bathing? 0  Doing errands, shopping? 0  Preparing Food and eating ? N  Using the Toilet? N  In the past six months, have you accidently leaked urine? N  Do you have problems with loss of bowel control? N  Managing your Medications? N  Managing your Finances? N  Housekeeping or managing your Housekeeping? N    Patient Care Team: Joaquim Nam, MD as PCP -  General (Family Medicine)  Indicate any recent Medical Services you may have received from other than [facility] providers in the past year (date may be approximate).     Assessment:   This is a routine wellness examination for Tierre.  Hearing/Vision screen Hearing Screening - Comments:: Pt says his hearing is good Vision Screening - Comments:: Pt says his vision is good w/glasses [facility]   Goals Addressed             This Visit's Progress    Patient Stated       I would like to do chair exercises       Depression Screen     03/09/2024    9:48 AM 04/08/2023    9:09 AM 03/30/2022    9:54 AM 03/24/2021   11:17 AM 02/02/2019    3:23 PM 01/11/2018    8:08 AM 09/27/2017   10:01 AM  PHQ 2/9 Scores  PHQ - 2 Score 0 0 0 0 0 0 0  PHQ- 9 Score  0         Fall Risk     03/09/2024    9:41 AM 04/08/2023    9:09 AM 03/30/2022    9:53 AM 12/13/2020    3:07 PM 01/11/2018    8:08 AM  Fall Risk   Falls in the past year? 0 0 0 0 No  Number falls in past yr: 0 0 0 0   Injury with Fall? 0 0 0 0   Risk for fall due to : No Fall Risks No Fall Risks No Fall Risks    Follow up Education provided;Falls prevention discussed Falls evaluation completed Falls evaluation completed Falls evaluation completed     MEDICARE RISK AT HOME:  Medicare Risk at Home Any stairs in or around the home?: Yes If so, are there any without handrails?: Yes Home  free of loose throw rugs in walkways, pet beds, electrical cords, etc?: Yes Adequate lighting in your home to reduce risk of falls?: Yes Life alert?: No Use of a cane, walker or w/c?: No Grab bars in the bathroom?: No Shower chair or bench in shower?: No Elevated toilet seat or a handicapped toilet?: No  TIMED UP AND GO:  Was the test performed?  No  Cognitive Function: 6CIT completed        03/09/2024    9:56 AM  6CIT Screen  What Year? 0 points  What month? 0 points  What time? 0 points  Count back from 20 0 points  Months in reverse 0  points  Repeat phrase 0 points  Total Score 0 points    Immunizations Immunization History  Administered Date(s) Administered   Fluad Trivalent(High Dose 65+) 09/27/2023   Influenza Split 08/04/2012   Influenza, High Dose Seasonal PF 09/11/2021, 08/12/2022   Influenza,inj,Quad PF,6+ Mos 08/08/2019, 09/13/2020   Influenza-Unspecified 08/15/2015, 09/13/2016, 08/19/2017, 07/31/2018   PFIZER(Purple Top)SARS-COV-2 Vaccination 02/29/2020, 03/26/2020, 10/04/2020   PNEUMOCOCCAL CONJUGATE-20 03/30/2022   Pfizer Covid-19 Vaccine Bivalent Booster 21yrs & up 05/19/2021   Pneumococcal Polysaccharide-23 09/27/2017   Tdap 08/04/2012, 04/19/2023   Zoster Recombinant(Shingrix) 10/07/2022    Screening Tests Health Maintenance  Topic Date Due   Zoster Vaccines- Shingrix (2 of 2) 12/02/2022   COVID-19 Vaccine (5 - 2024-25 season) 08/01/2023   Diabetic kidney evaluation - eGFR measurement  03/28/2024   Diabetic kidney evaluation - Urine ACR  03/28/2024   OPHTHALMOLOGY EXAM  03/14/2024   HEMOGLOBIN A1C  03/27/2024   FOOT EXAM  04/07/2024   INFLUENZA VACCINE  06/30/2024   Medicare Annual Wellness (AWV)  03/09/2025   Colonoscopy  12/14/2027   DTaP/Tdap/Td (3 - Td or Tdap) 04/18/2033   Pneumonia Vaccine 70+ Years old  Completed   Hepatitis C Screening  Completed   HPV VACCINES  Aged Out   Meningococcal B Vaccine  Aged Out    Health Maintenance  Health Maintenance Due  Topic Date Due   Zoster Vaccines- Shingrix (2 of 2) 12/02/2022   COVID-19 Vaccine (5 - 2024-25 season) 08/01/2023   Diabetic kidney evaluation - eGFR measurement  03/28/2024   Diabetic kidney evaluation - Urine ACR  03/28/2024   Health Maintenance Items Addressed: None needed  Additional Screening:  Vision Screening: Recommended annual ophthalmology exams for early detection of glaucoma and other disorders of the eye.  Dental Screening: Recommended annual dental exams for proper oral hygiene  Community Resource  Referral / Chronic Care Management: CRR required this visit?  No   CCM required this visit?  No    Plan:     I have personally reviewed and noted the following in the patient's chart:   Medical and social history Use of alcohol, tobacco or illicit drugs  Current medications and supplements including opioid prescriptions. Patient is not currently taking opioid prescriptions. Functional ability and status Nutritional status Physical activity Advanced directives List of other physicians Hospitalizations, surgeries, and ER visits in previous 12 months Vitals Screenings to include cognitive, depression, and falls Referrals and appointments  In addition, I have reviewed and discussed with patient certain preventive protocols, quality metrics, and best practice recommendations. A written personalized care plan for preventive services as well as general preventive health recommendations were provided to patient.    Sue Lush, LPN   1/61/0960   After Visit Summary: (MyChart) Due to this being a telephonic visit, the after visit summary with  patients personalized plan was offered to patient via MyChart   Notes: Nothing significant to report at this time.

## 2024-03-09 NOTE — Patient Instructions (Signed)
 Mr. James Weaver , Thank you for taking time to come for your Medicare Wellness Visit. I appreciate your ongoing commitment to your health goals. Please review the following plan we discussed and let me know if I can assist you in the future.   Referrals/Orders/Follow-Ups/Clinician Recommendations: none  This is a list of the screening recommended for you and due dates:  Health Maintenance  Topic Date Due   Zoster (Shingles) Vaccine (2 of 2) 12/02/2022   COVID-19 Vaccine (5 - 2024-25 season) 08/01/2023   Yearly kidney function blood test for diabetes  03/28/2024   Yearly kidney health urinalysis for diabetes  03/28/2024   Eye exam for diabetics  03/14/2024   Hemoglobin A1C  03/27/2024   Complete foot exam   04/07/2024   Flu Shot  06/30/2024   Medicare Annual Wellness Visit  03/09/2025   Colon Cancer Screening  12/14/2027   DTaP/Tdap/Td vaccine (3 - Td or Tdap) 04/18/2033   Pneumonia Vaccine  Completed   Hepatitis C Screening  Completed   HPV Vaccine  Aged Out   Meningitis B Vaccine  Aged Out    Advanced directives: (Copy Requested) Please bring a copy of your health care power of attorney and living will to the office to be added to your chart at your convenience. You can mail to [facility] [address] [place], [place] [address] or email to [email or web address]  Next Medicare Annual Wellness Visit scheduled for next year: Yes 03/12/24 @ 9:30am televisit

## 2024-03-22 DIAGNOSIS — H2513 Age-related nuclear cataract, bilateral: Secondary | ICD-10-CM | POA: Diagnosis not present

## 2024-03-22 DIAGNOSIS — E119 Type 2 diabetes mellitus without complications: Secondary | ICD-10-CM | POA: Diagnosis not present

## 2024-03-22 DIAGNOSIS — H53143 Visual discomfort, bilateral: Secondary | ICD-10-CM | POA: Diagnosis not present

## 2024-03-22 LAB — HM DIABETES EYE EXAM

## 2024-04-02 ENCOUNTER — Other Ambulatory Visit: Payer: Self-pay | Admitting: Family Medicine

## 2024-04-02 DIAGNOSIS — E119 Type 2 diabetes mellitus without complications: Secondary | ICD-10-CM

## 2024-04-03 ENCOUNTER — Other Ambulatory Visit (INDEPENDENT_AMBULATORY_CARE_PROVIDER_SITE_OTHER): Payer: Medicare HMO

## 2024-04-03 DIAGNOSIS — E119 Type 2 diabetes mellitus without complications: Secondary | ICD-10-CM | POA: Diagnosis not present

## 2024-04-03 LAB — CBC WITH DIFFERENTIAL/PLATELET
Basophils Absolute: 0 10*3/uL (ref 0.0–0.1)
Basophils Relative: 0.5 % (ref 0.0–3.0)
Eosinophils Absolute: 0.2 10*3/uL (ref 0.0–0.7)
Eosinophils Relative: 2.5 % (ref 0.0–5.0)
HCT: 44.5 % (ref 39.0–52.0)
Hemoglobin: 15.4 g/dL (ref 13.0–17.0)
Lymphocytes Relative: 31.1 % (ref 12.0–46.0)
Lymphs Abs: 1.9 10*3/uL (ref 0.7–4.0)
MCHC: 34.6 g/dL (ref 30.0–36.0)
MCV: 94.6 fl (ref 78.0–100.0)
Monocytes Absolute: 0.6 10*3/uL (ref 0.1–1.0)
Monocytes Relative: 9.6 % (ref 3.0–12.0)
Neutro Abs: 3.5 10*3/uL (ref 1.4–7.7)
Neutrophils Relative %: 56.3 % (ref 43.0–77.0)
Platelets: 268 10*3/uL (ref 150.0–400.0)
RBC: 4.7 Mil/uL (ref 4.22–5.81)
RDW: 13.2 % (ref 11.5–15.5)
WBC: 6.2 10*3/uL (ref 4.0–10.5)

## 2024-04-03 LAB — COMPREHENSIVE METABOLIC PANEL WITH GFR
ALT: 32 U/L (ref 0–53)
AST: 23 U/L (ref 0–37)
Albumin: 4.3 g/dL (ref 3.5–5.2)
Alkaline Phosphatase: 46 U/L (ref 39–117)
BUN: 14 mg/dL (ref 6–23)
CO2: 31 meq/L (ref 19–32)
Calcium: 9.4 mg/dL (ref 8.4–10.5)
Chloride: 97 meq/L (ref 96–112)
Creatinine, Ser: 0.8 mg/dL (ref 0.40–1.50)
GFR: 91.13 mL/min (ref >60.00)
Glucose, Bld: 158 mg/dL — ABNORMAL HIGH (ref 70–99)
Potassium: 3.8 meq/L (ref 3.5–5.1)
Sodium: 138 meq/L (ref 135–145)
Total Bilirubin: 1.2 mg/dL (ref 0.2–1.2)
Total Protein: 7.2 g/dL (ref 6.0–8.3)

## 2024-04-03 LAB — LIPID PANEL
Cholesterol: 134 mg/dL (ref 0–200)
HDL: 32.9 mg/dL — ABNORMAL LOW (ref >39.00)
LDL Cholesterol: 74 mg/dL (ref 0–99)
NonHDL: 101.16
Total CHOL/HDL Ratio: 4
Triglycerides: 137 mg/dL (ref 0.0–149.0)
VLDL: 27.4 mg/dL (ref 0.0–40.0)

## 2024-04-03 LAB — MICROALBUMIN / CREATININE URINE RATIO
Creatinine,U: 166.3 mg/dL
Microalb Creat Ratio: 5 mg/g (ref 0.0–30.0)
Microalb, Ur: 0.8 mg/dL (ref 0.0–1.9)

## 2024-04-03 LAB — HEMOGLOBIN A1C: Hgb A1c MFr Bld: 7.2 % — ABNORMAL HIGH (ref 4.6–6.5)

## 2024-04-10 ENCOUNTER — Encounter: Payer: Self-pay | Admitting: Family Medicine

## 2024-04-10 ENCOUNTER — Ambulatory Visit (INDEPENDENT_AMBULATORY_CARE_PROVIDER_SITE_OTHER): Payer: Medicare HMO | Admitting: Family Medicine

## 2024-04-10 VITALS — BP 122/62 | HR 70 | Temp 97.8°F | Ht 66.93 in | Wt 208.4 lb

## 2024-04-10 DIAGNOSIS — N529 Male erectile dysfunction, unspecified: Secondary | ICD-10-CM

## 2024-04-10 DIAGNOSIS — Z7189 Other specified counseling: Secondary | ICD-10-CM

## 2024-04-10 DIAGNOSIS — E785 Hyperlipidemia, unspecified: Secondary | ICD-10-CM | POA: Diagnosis not present

## 2024-04-10 DIAGNOSIS — Z7984 Long term (current) use of oral hypoglycemic drugs: Secondary | ICD-10-CM

## 2024-04-10 DIAGNOSIS — E119 Type 2 diabetes mellitus without complications: Secondary | ICD-10-CM | POA: Diagnosis not present

## 2024-04-10 DIAGNOSIS — I10 Essential (primary) hypertension: Secondary | ICD-10-CM | POA: Diagnosis not present

## 2024-04-10 DIAGNOSIS — Z0000 Encounter for general adult medical examination without abnormal findings: Secondary | ICD-10-CM

## 2024-04-10 DIAGNOSIS — M6530 Trigger finger, unspecified finger: Secondary | ICD-10-CM

## 2024-04-10 MED ORDER — SILDENAFIL CITRATE 20 MG PO TABS: ORAL_TABLET | ORAL | 12 refills | Status: AC

## 2024-04-10 MED ORDER — ATENOLOL-CHLORTHALIDONE 50-25 MG PO TABS: 0.5000 | ORAL_TABLET | Freq: Every day | ORAL | 3 refills | Status: AC

## 2024-04-10 MED ORDER — METFORMIN HCL 500 MG PO TABS: ORAL_TABLET | ORAL | Status: DC

## 2024-04-10 MED ORDER — ATORVASTATIN CALCIUM 80 MG PO TABS: 80.0000 mg | ORAL_TABLET | Freq: Every day | ORAL | 3 refills | Status: AC

## 2024-04-10 NOTE — Progress Notes (Unsigned)
 Flu previously done Shingles prev done at [facility] previously done Tetanus 2024 COVID-vaccine previously done. Colonoscopy 2019 Prostate cancer screening deferred.  Discussed with patient. Advance directive-wife designated if patient were incapacitated.  ED.  No ADE on sildenafil .  It helped.    Diabetes:  Using medications without difficulties: see below.   Hypoglycemic episodes:rare, cautions d/w pt.   Hyperglycemic episodes:no Feet problems: no Blood Sugars averaging: usually ~140s in the AMs eye exam within last year: Dr. Annabell Key, a few weeks ago.   Taking 2 metformin  in the AM and 1 in the PM.  Some occ loose stools in the afternoon.  D/w pt about 1 TID vs 1.5 BID, so he isn't taking 2 tabs in the AM.    Hypertension:    Using medication without problems or lightheadedness: yes Chest pain with exertion:no Edema:no Short of breath: only if sig exertion, with likely relative deconditioning.    Elevated Cholesterol: Using medications without problems: yes Muscle aches: no Diet compliance: d/w pt.   Exercise:d/w pt.   Labs d/w pt.   Trigger finger.  R 3rd finger gets stuck in flexion, more in the AM.  D/w pt about options.    PMH and SH reviewed.   Vital signs, Meds and allergies reviewed.  ROS: Per HPI unless specifically indicated in ROS section   GEN: nad, alert and oriented HEENT: ncat NECK: supple w/o LA CV: rrr PULM: ctab, no inc wob ABD: soft, +bs EXT: no edema SKIN: no acute rash R 3rd finger gets stuck in flexion but he is able to manually extend it.  Diabetic foot exam: Normal inspection No skin breakdown No calluses  Normal DP pulses Normal sensation to light tough and monofilament Nails normal

## 2024-04-10 NOTE — Patient Instructions (Addendum)
 Recheck in about 6 months.  A1c at the visit.  Ask about seeing Dr. Geralyn Knee about your finger if needed.   Take care.  Glad to see you.

## 2024-04-12 ENCOUNTER — Encounter: Payer: Self-pay | Admitting: Family Medicine

## 2024-04-12 DIAGNOSIS — Z0000 Encounter for general adult medical examination without abnormal findings: Secondary | ICD-10-CM | POA: Insufficient documentation

## 2024-04-12 DIAGNOSIS — M6530 Trigger finger, unspecified finger: Secondary | ICD-10-CM | POA: Insufficient documentation

## 2024-04-12 NOTE — Assessment & Plan Note (Signed)
Continue atorvastatin.  Continue work on diet and exercise. 

## 2024-04-12 NOTE — Assessment & Plan Note (Signed)
 No ADE on sildenafil .  It helped.   Continue as is.

## 2024-04-12 NOTE — Assessment & Plan Note (Signed)
 I would like him to ask about seeing Dr. Geralyn Knee if needed.  Discussed.

## 2024-04-12 NOTE — Assessment & Plan Note (Signed)
 Continue atenolol  chlorthalidone .  Continue work on diet and exercise.

## 2024-04-12 NOTE — Assessment & Plan Note (Signed)
 Taking 2 metformin  in the AM and 1 in the PM.  Some occ loose stools in the afternoon.  D/w pt about 1 TID vs 1.5 BID, so he isn't taking 2 tabs in the AM.   Recheck in about 6 months.  A1c at the visit.  Continue work on diet and exercise.

## 2024-04-12 NOTE — Assessment & Plan Note (Signed)
 Advance directive- wife designated if patient were incapacitated.

## 2024-04-12 NOTE — Assessment & Plan Note (Signed)
 Flu previously done Shingles prev done at [facility] previously done Tetanus 2024 COVID-vaccine previously done. Colonoscopy 2019 Prostate cancer screening deferred.  Discussed with patient. Advance directive-wife designated if patient were incapacitated.

## 2024-05-26 ENCOUNTER — Other Ambulatory Visit: Payer: Self-pay | Admitting: Family Medicine

## 2024-05-29 NOTE — Telephone Encounter (Signed)
 Sent. Thanks.  Please verify pharmacy with patient- I sent this to [facility], [place].  Let me know if he needs it locally.

## 2024-10-12 ENCOUNTER — Ambulatory Visit: Admitting: Family Medicine

## 2024-10-12 ENCOUNTER — Encounter: Payer: Self-pay | Admitting: Family Medicine

## 2024-10-12 VITALS — BP 120/70 | HR 73 | Temp 98.0°F | Ht 66.93 in | Wt 201.4 lb

## 2024-10-12 DIAGNOSIS — E119 Type 2 diabetes mellitus without complications: Secondary | ICD-10-CM | POA: Diagnosis not present

## 2024-10-12 DIAGNOSIS — Z7984 Long term (current) use of oral hypoglycemic drugs: Secondary | ICD-10-CM | POA: Diagnosis not present

## 2024-10-12 LAB — POCT GLYCOSYLATED HEMOGLOBIN (HGB A1C): Hemoglobin A1C: 6.9 % — AB (ref 4.0–5.6)

## 2024-10-12 MED ORDER — METFORMIN HCL 500 MG PO TABS: ORAL_TABLET | ORAL | Status: AC

## 2024-10-12 NOTE — Progress Notes (Signed)
 Diabetes:  Using medications without difficulties: yes Hypoglycemic episodes:no recent events- only if prolonged fasting.  Cautions d/w pt.   Hyperglycemic episodes: no Feet problems: no Blood Sugars averaging:  usually ~150 in the AMs.   eye exam within last year: yes A1c 6.9, d/w pt at [facility].  Taking metformin  1000mg  in the AM and 500mg  in the PM.  He can tolerate that w/o ADE other than some occ loose stools.   On atorvastatin .   He is walking for exercise, working on diet.    Meds, vitals, and allergies reviewed.  ROS: Per HPI unless specifically indicated in ROS section   GEN: nad, alert and oriented HEENT: mucous membranes moist NECK: supple w/o LA CV: rrr. PULM: ctab, no inc wob ABD: soft, +bs EXT: no edema SKIN: well perfused.

## 2024-10-12 NOTE — Assessment & Plan Note (Signed)
 Continue metformin .  Continue work on diet and exercise.  Recheck in about 6 months.  Yearly visit, labs ahead of time if possible.  A1c improved.

## 2024-10-12 NOTE — Patient Instructions (Addendum)
 Don't change your meds.  Recheck in about 6 months.  Yearly visit, labs ahead of time if possible.  Take care.  Glad to see you. Thanks for your effort.

## 2024-12-06 ENCOUNTER — Ambulatory Visit: Admitting: General Practice

## 2024-12-06 ENCOUNTER — Ambulatory Visit: Payer: Self-pay | Admitting: General Practice

## 2024-12-06 ENCOUNTER — Encounter: Payer: Self-pay | Admitting: General Practice

## 2024-12-06 ENCOUNTER — Telehealth: Payer: Self-pay

## 2024-12-06 VITALS — BP 124/82 | HR 68 | Temp 97.9°F | Ht 66.0 in | Wt 209.0 lb

## 2024-12-06 DIAGNOSIS — R0981 Nasal congestion: Secondary | ICD-10-CM

## 2024-12-06 DIAGNOSIS — J0100 Acute maxillary sinusitis, unspecified: Secondary | ICD-10-CM | POA: Diagnosis not present

## 2024-12-06 DIAGNOSIS — R051 Acute cough: Secondary | ICD-10-CM

## 2024-12-06 LAB — POCT INFLUENZA A/B
Influenza A, POC: NEGATIVE
Influenza B, POC: NEGATIVE

## 2024-12-06 LAB — POC COVID19 BINAXNOW: SARS Coronavirus 2 Ag: NEGATIVE

## 2024-12-06 MED ORDER — AMOXICILLIN-POT CLAVULANATE 875-125 MG PO TABS: 1.0000 | ORAL_TABLET | Freq: Two times a day (BID) | ORAL | 0 refills | Status: AC

## 2024-12-06 NOTE — Progress Notes (Signed)
 "  Established Patient Office Visit  Subjective   Patient ID: James Weaver, male    DOB: [birth year]  Age: 69 y.o. MRN: [id number]  Chief Complaint  Patient presents with   sinus congestion    Pt is here today to be seen for some sinus congestion that started a week ago. He mentions that he has also developed a cough on Saturday. He has treated with OTC cough medication    HPI  Discussed the use of AI scribe software for clinical note transcription with the patient, who gave verbal consent to proceed.  History of Present Illness James Weaver is a 69 year old male who presents with sinus congestion and postnasal drip.  He has been experiencing sinus congestion and postnasal drip for about a week, characterized by drainage and congestion that irritates his throat and causes a cough. He is not producing significant sputum. He describes his head as feeling 'stopped up' and notes a ' lot of green stuff' when blowing his nose, especially in the morning.  He has been taking Mucinex 12-hour and Delsym 12-hour at 10 AM and 10 PM, which he feels have been helpful. He also uses Flonase , particularly when doing yard work, but is unsure of its effectiveness in this instance.  No fever, chills, ear pain, sinus pain and pressure, dizziness, headaches, or gastrointestinal symptoms such as nausea, vomiting, or diarrhea.   He has not been around anyone who has been sick recently, including his four grandkids who live with him.  He is up to date on flu and pneumonia vaccine.    Patient Active Problem List   Diagnosis Date Noted   Sinus congestion 12/06/2024   Acute cough 12/06/2024   Healthcare maintenance 04/12/2024   Trigger finger 04/12/2024   Medicare welcome exam 03/26/2021   Erectile dysfunction 03/18/2020   Hives 03/18/2020   FH: CAD (coronary artery disease) 09/02/2016   Partial tear of left Achilles tendon 07/18/2015   Advance care planning 05/31/2015   Diabetes mellitus without  complication (HCC) 02/22/2014   ACL TEAR, RIGHT KNEE 03/31/2010   TIBIALIS TENDINITIS 10/09/2009   HLD (hyperlipidemia) 08/22/2007   Essential hypertension 08/22/2007   Past Medical History:  Diagnosis Date   Diabetes mellitus without complication (HCC)    Hyperlipemia    Hypertension    Past Surgical History:  Procedure Laterality Date   ANTERIOR CRUCIATE LIGAMENT REPAIR  Jan '11   left knee   arthroscopic  knee surg     left 2000   COLONOSCOPY     Allergies[1]       12/06/2024   11:36 AM 10/12/2024    8:14 AM 04/10/2024    8:21 AM  Depression screen PHQ 2/9  Decreased Interest 0 0 0  Down, Depressed, Hopeless 0 0 0  PHQ - 2 Score 0 0 0  Altered sleeping 0 0 0  Tired, decreased energy 2 0 0  Change in appetite 0 0 0  Feeling bad or failure about yourself  0 0 0  Trouble concentrating 0 0 0  Moving slowly or fidgety/restless 0 0 0  Suicidal thoughts 0 0 0  PHQ-9 Score 2 0 0   Difficult doing work/chores Somewhat difficult Not difficult at all Not difficult at all     Data saved with a previous flowsheet row definition       12/06/2024   11:37 AM 10/12/2024    8:14 AM 04/10/2024    8:22 AM 04/08/2023    9:09  AM  GAD 7 : Generalized Anxiety Score  Nervous, Anxious, on Edge 0 0 0 0  Control/stop worrying 0 0 0 0  Worry too much - different things 0 0 0 0  Trouble relaxing 0 0 0 0  Restless 0 0 0 0  Easily annoyed or irritable 0 0 0 0  Afraid - awful might happen 0 0 0 0  Total GAD 7 Score 0 0 0 0  Anxiety Difficulty Not difficult at all Not difficult at all Not difficult at all Not difficult at all      Review of Systems  Constitutional:  Negative for chills and fever.  HENT:  Positive for nosebleeds. Negative for ear pain, sinus pain and sore throat.   Respiratory:  Positive for cough. Negative for sputum production and shortness of breath.   Cardiovascular:  Negative for chest pain.  Gastrointestinal:  Negative for abdominal pain, constipation, diarrhea,  heartburn, nausea and vomiting.  Genitourinary:  Negative for dysuria, frequency and urgency.  Neurological:  Negative for dizziness and headaches.  Endo/Heme/Allergies:  Negative for polydipsia.  Psychiatric/Behavioral:  Negative for depression and suicidal ideas. The patient is not nervous/anxious.       Objective:     BP 124/82   Pulse 68   Temp 97.9 F (36.6 C)   Ht 5' 6 (1.676 m)   Wt 209 lb (94.8 kg)   SpO2 97%   BMI 33.73 kg/m  BP Readings from Last 3 Encounters:  12/06/24 124/82  10/12/24 120/70  04/10/24 122/62   Wt Readings from Last 3 Encounters:  12/06/24 209 lb (94.8 kg)  10/12/24 201 lb 6 oz (91.3 kg)  04/10/24 208 lb 6.4 oz (94.5 kg)      Physical Exam Vitals and nursing note reviewed.  Constitutional:      Appearance: Normal appearance.  HENT:     Right Ear: Tympanic membrane, ear canal and external ear normal.     Left Ear: Tympanic membrane, ear canal and external ear normal.     Nose: Congestion present. No rhinorrhea.     Right Sinus: No maxillary sinus tenderness or frontal sinus tenderness.     Left Sinus: No maxillary sinus tenderness or frontal sinus tenderness.     Mouth/Throat:     Pharynx: Oropharynx is clear. Postnasal drip present.  Eyes:     Conjunctiva/sclera: Conjunctivae normal.  Cardiovascular:     Rate and Rhythm: Normal rate and regular rhythm.     Pulses: Normal pulses.     Heart sounds: Normal heart sounds.  Pulmonary:     Effort: Pulmonary effort is normal.     Breath sounds: Normal breath sounds.  Neurological:     Mental Status: He is alert and oriented to person, place, and time.  Psychiatric:        Mood and Affect: Mood normal.        Behavior: Behavior normal.        Thought Content: Thought content normal.        Judgment: Judgment normal.      Results for orders placed or performed in visit on 12/06/24  POC COVID-19 BinaxNow  Result Value Ref Range   SARS Coronavirus 2 Ag Negative Negative  POCT  Influenza A/B  Result Value Ref Range   Influenza A, POC Negative Negative   Influenza B, POC Negative Negative       The 10-year ASCVD risk score (Arnett DK, et al., 2019) is: 30.1%    Assessment &  Plan:  Sinus congestion -     POC COVID-19 BinaxNow -     POCT Influenza A/B  Acute cough -     POC COVID-19 BinaxNow -     POCT Influenza A/B    Assessment and Plan Assessment & Plan Acute sinusitis Symptoms suggestive of bacterial sinusitis. Negative COVID and flu tests today. - Prescribed Augmentin  1 tablet twice daily for 7 days. - Instructed to complete full course of antibiotics. - Continue Mucinex and Delsym as needed. - Use Flonase  for nasal congestion. - Encouraged increased fluid intake and rest. - Use cool mist humidifier during sleep. - Follow up if symptoms worsen or do not improve. - Seek emergency care if experiencing chest pain, shortness of breath, or difficulty breathing.   Return if symptoms worsen or fail to improve.    Carrol Aurora, NP     [1] No Known Allergies  "

## 2024-12-06 NOTE — Telephone Encounter (Signed)
 Copied from CRM [id number]. Topic: Clinical - Prescription Issue >> Dec 06, 2024  4:33 PM James Weaver wrote: Reason for CRM: Patient had a visit today 12-06-24 and patient was to have had Augmentin  called in to pharmacy of choice and patient states its still not at pharmacy , [address] Please return to call to patient concerning this   I looked in chart and do not see that antibiotic was sent to pharmacy.

## 2024-12-06 NOTE — Addendum Note (Signed)
 Addended by: VINCENTE SHIVERS on: 12/06/2024 08:00 PM   Modules accepted: Orders

## 2024-12-06 NOTE — Telephone Encounter (Signed)
 Called patient and advised that not sure why the antibiotic wasn't sent. He was very understanding.  Resent Augmentin  antibiotics. Take 1 tablet by mouth twice daily for 7 days.   He will update if symptoms worsen or do not improve.  -Carrol Aurora, DNP, AGNP-C 12/06/2024 8:02 PM

## 2024-12-06 NOTE — Patient Instructions (Addendum)
 Start Augmentin  antibiotics. Take 1 tablet by mouth twice daily for 7 days.   You can try a few things over the counter to help with your symptoms including:  Cough: Delsym or Robitussin (get the off brand, works just as well) Chest Congestion: Mucinex (plain) Nasal Congestion/Ear Pressure/Sinus Pressure: Try using Flonase  (fluticasone ) nasal spray. Instill 1 spray in each nostril twice daily. This can be purchased over the counter. Body aches, fevers, headache: Ibuprofen (not to exceed 2400 mg in 24 hours) or Acetaminophen-Tylenol (not to exceed 3000 mg in 24 hours) Runny Nose/Throat Drainage/Sneezing/Itchy or Watery Eyes: An antihistamine such as Zyrtec, Claritin, Xyzal, Allegra  Drink plenty of fluids.  Rest.  Use cool mist humidifier.   Follow up if symptoms worsen or do not improve.   It was a pleasure to see you today!

## 2025-03-12 ENCOUNTER — Ambulatory Visit

## 2025-04-04 ENCOUNTER — Other Ambulatory Visit

## 2025-04-12 ENCOUNTER — Encounter: Admitting: Family Medicine
